# Patient Record
Sex: Female | Born: 1985 | Race: Black or African American | Hispanic: No | Marital: Single | Smoking: Former smoker
Health system: Southern US, Community
[De-identification: ages and names within clinical notes are randomized; demographics above are authoritative.]

## PROBLEM LIST (undated history)

## (undated) DIAGNOSIS — K8020 Calculus of gallbladder without cholecystitis without obstruction: Secondary | ICD-10-CM

## (undated) DIAGNOSIS — F329 Major depressive disorder, single episode, unspecified: Secondary | ICD-10-CM

## (undated) DIAGNOSIS — F32A Depression, unspecified: Secondary | ICD-10-CM

## (undated) DIAGNOSIS — F419 Anxiety disorder, unspecified: Secondary | ICD-10-CM

## (undated) HISTORY — PX: TONSILLECTOMY: SUR1361

---

## 2006-02-14 ENCOUNTER — Emergency Department: Payer: Self-pay | Admitting: General Practice

## 2010-09-23 ENCOUNTER — Observation Stay: Payer: Self-pay | Admitting: Obstetrics and Gynecology

## 2011-01-21 ENCOUNTER — Observation Stay: Payer: Self-pay | Admitting: Obstetrics and Gynecology

## 2011-01-22 ENCOUNTER — Inpatient Hospital Stay: Payer: Self-pay | Admitting: Obstetrics and Gynecology

## 2011-07-17 ENCOUNTER — Emergency Department: Payer: Self-pay | Admitting: Internal Medicine

## 2013-09-09 ENCOUNTER — Encounter (HOSPITAL_COMMUNITY): Payer: Self-pay | Admitting: Emergency Medicine

## 2013-09-09 ENCOUNTER — Emergency Department (HOSPITAL_COMMUNITY)
Admission: EM | Admit: 2013-09-09 | Discharge: 2013-09-09 | Disposition: A | Discharge status: Home / Self Care | Payer: Medicaid Other | Attending: Emergency Medicine | Admitting: Emergency Medicine

## 2013-09-09 DIAGNOSIS — O9989 Other specified diseases and conditions complicating pregnancy, childbirth and the puerperium: Secondary | ICD-10-CM | POA: Insufficient documentation

## 2013-09-09 DIAGNOSIS — Z3490 Encounter for supervision of normal pregnancy, unspecified, unspecified trimester: Secondary | ICD-10-CM

## 2013-09-09 DIAGNOSIS — Z8659 Personal history of other mental and behavioral disorders: Secondary | ICD-10-CM | POA: Insufficient documentation

## 2013-09-09 DIAGNOSIS — Z3201 Encounter for pregnancy test, result positive: Secondary | ICD-10-CM | POA: Insufficient documentation

## 2013-09-09 DIAGNOSIS — K5900 Constipation, unspecified: Secondary | ICD-10-CM | POA: Insufficient documentation

## 2013-09-09 DIAGNOSIS — O99330 Smoking (tobacco) complicating pregnancy, unspecified trimester: Secondary | ICD-10-CM | POA: Insufficient documentation

## 2013-09-09 HISTORY — DX: Depression, unspecified: F32.A

## 2013-09-09 HISTORY — DX: Major depressive disorder, single episode, unspecified: F32.9

## 2013-09-09 HISTORY — DX: Anxiety disorder, unspecified: F41.9

## 2013-09-09 LAB — POCT PREGNANCY, URINE: Preg Test, Ur: POSITIVE — AB

## 2013-09-09 MED ORDER — PRENATAL COMPLETE 14-0.4 MG PO TABS: 1.0000 | ORAL_TABLET | Freq: Every day | ORAL | Status: DC

## 2013-09-09 NOTE — ED Notes (Signed)
Patient states that she has been constipated x 1 month.   Patient states she went out last night and she was drinking.  She states that she has been having abdominal pain today.

## 2013-09-09 NOTE — ED Provider Notes (Signed)
CSN: [id number]     Arrival date & time 09/09/13  1359 History   First MD Initiated Contact with Patient 09/09/13 1406     Chief Complaint  Patient presents with  . Abdominal Pain   (Consider location/radiation/quality/duration/timing/severity/associated sxs/prior Treatment) The history is provided by the patient and medical records.   Patient presents to the ED for constipation.  Patient states she went out with friends last night and did have a few alcoholic beverages. She ate at [facility] at approximately 0400 this morning.  Pt states upon waking this morning she felt very nauseated and had one episode of non-bloody, non-bilious vomiting.  No further episodes of vomiting.  Pt has not tried to eat since earlier this morning but states she feels hungry. Patient states she's always had irregular bowel movements, sometimes 1 bowel movement per week but states she has not had a normal bowel movement in approximately one month. She is freely passing gas.  Has had some cramping, lower abdominal pain today.  No fevers, sweats, or chills.   No urinary sx or vaginal complaints.  Past Medical History  Diagnosis Date  . Anxiety   . Depression    Past Surgical History  Procedure Laterality Date  . Tonsillectomy     No family history on file. History  Substance Use Topics  . Smoking status: Current Some Day Smoker    Types: Cigarettes  . Smokeless tobacco: Not on file  . Alcohol Use: Yes   OB History   Grav Para Term Preterm Abortions TAB SAB Ect Mult Living                 Review of Systems  Gastrointestinal: Positive for constipation.  All other systems reviewed and are negative.    Allergies  Review of patient's allergies indicates no known allergies.  Home Medications  No current outpatient prescriptions on file. BP 106/67  Pulse 91  Temp(Src) 98.3 F (36.8 C) (Oral)  Resp 20  Ht 5\' 1"  (1.549 m)  Wt 146 lb (66.225 kg)  BMI 27.6 kg/m2  SpO2 100%  LMP 07/10/2013  Physical  Exam  Nursing note and vitals reviewed. Constitutional: She is oriented to person, place, and time. She appears well-developed and well-nourished. No distress.  HENT:  Head: Normocephalic and atraumatic.  Mouth/Throat: Oropharynx is clear and moist.  Eyes: Conjunctivae and EOM are normal. Pupils are equal, round, and reactive to light.  Neck: Normal range of motion. Neck supple.  Cardiovascular: Normal rate, regular rhythm and normal heart sounds.   Pulmonary/Chest: Effort normal and breath sounds normal. No respiratory distress. She has no wheezes.  Abdominal: Soft. Bowel sounds are normal. She exhibits no distension. There is no tenderness. There is no guarding.  Abdomen soft, no rebound or guarding; no peritoneal signs  Musculoskeletal: Normal range of motion. She exhibits no edema.  Neurological: She is alert and oriented to person, place, and time.  Skin: Skin is warm and dry. She is not diaphoretic.  Psychiatric: She has a normal mood and affect.    ED Course  Procedures (including critical care time) Labs Review Labs Reviewed - No data to display Imaging Review No results found.  MDM   1. Pregnancy    u-preg done prior to abd imaging incidentally positive.  Pt is now G4P2.  Last menstrual she remembers was in May.  No hx of ectopic pregnancies.  Has had some spotting in the past few months, but no vaginal bleeding at this time.  I  have recommended a pelvic exam, pt declined.  Bedside u/s performed at bedside by Dr. Hyacinth Meeker revealing IUP w/cardiac activity, likely 4-5 months gestation.    I doubt acute/surgical abdomen at this including SBO.  Encouraged high fiber diet and increase water to help with BMs. Will start on prenatal vitamins and have pt FU with OB-GYN. Discussed plan with pt, she agreed.  Return precautions advised.  Garlon Hatchet, PA-C 09/09/13 1556

## 2013-09-09 NOTE — ED Provider Notes (Signed)
27 year old, presents with inconsistent bowel movements, on exam she has soft abdomen, she does have an ultrasound showing intrauterine pregnancy on my exam, the patient was appear stable, she can followup as an outpatient.  Medical screening examination/treatment/procedure(s) were conducted as a shared visit with non-physician practitioner(s) and myself.  I personally evaluated the patient during the encounter.        Vida Roller, MD 09/09/13 1540

## 2013-09-12 NOTE — ED Provider Notes (Signed)
Medical screening examination/treatment/procedure(s) were conducted as a shared visit with non-physician practitioner(s) and myself.  I personally evaluated the patient during the encounter  Please see my separate respective documentation pertaining to this patient encounter   Vida Roller, MD 09/12/13 1456

## 2014-07-29 ENCOUNTER — Observation Stay: Payer: Self-pay | Admitting: Obstetrics and Gynecology

## 2014-07-29 LAB — CBC WITH DIFFERENTIAL/PLATELET
Basophil #: 0 10*3/uL (ref 0.0–0.1)
Basophil %: 0.2 %
EOS PCT: 2.2 %
Eosinophil #: 0.3 10*3/uL (ref 0.0–0.7)
HCT: 32.5 % — AB (ref 35.0–47.0)
HGB: 10.4 g/dL — ABNORMAL LOW (ref 12.0–16.0)
Lymphocyte #: 2 10*3/uL (ref 1.0–3.6)
Lymphocyte %: 14.1 %
MCH: 29.4 pg (ref 26.0–34.0)
MCHC: 31.9 g/dL — ABNORMAL LOW (ref 32.0–36.0)
MCV: 92 fL (ref 80–100)
MONOS PCT: 4.6 %
Monocyte #: 0.7 x10 3/mm (ref 0.2–0.9)
Neutrophil #: 11.3 10*3/uL — ABNORMAL HIGH (ref 1.4–6.5)
Neutrophil %: 78.9 %
PLATELETS: 225 10*3/uL (ref 150–440)
RBC: 3.52 10*6/uL — AB (ref 3.80–5.20)
RDW: 13.6 % (ref 11.5–14.5)
WBC: 14.3 10*3/uL — ABNORMAL HIGH (ref 3.6–11.0)

## 2014-11-03 ENCOUNTER — Inpatient Hospital Stay: Payer: Self-pay

## 2014-11-05 LAB — CBC WITH DIFFERENTIAL/PLATELET
BASOS ABS: 0.1 10*3/uL (ref 0.0–0.1)
Basophil %: 0.6 %
EOS ABS: 0.1 10*3/uL (ref 0.0–0.7)
EOS PCT: 0.9 %
HCT: 31.6 % — AB (ref 35.0–47.0)
HGB: 9.8 g/dL — ABNORMAL LOW (ref 12.0–16.0)
LYMPHS ABS: 1.7 10*3/uL (ref 1.0–3.6)
LYMPHS PCT: 14.9 %
MCH: 25.3 pg — ABNORMAL LOW (ref 26.0–34.0)
MCHC: 31 g/dL — ABNORMAL LOW (ref 32.0–36.0)
MCV: 82 fL (ref 80–100)
MONO ABS: 1 x10 3/mm — AB (ref 0.2–0.9)
Monocyte %: 8.5 %
NEUTROS PCT: 75.1 %
Neutrophil #: 8.7 10*3/uL — ABNORMAL HIGH (ref 1.4–6.5)
Platelet: 157 10*3/uL (ref 150–440)
RBC: 3.86 10*6/uL (ref 3.80–5.20)
RDW: 16.1 % — AB (ref 11.5–14.5)
WBC: 11.6 10*3/uL — ABNORMAL HIGH (ref 3.6–11.0)

## 2014-11-06 LAB — HEMATOCRIT: HCT: 30.2 % — AB (ref 35.0–47.0)
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
## 2015-12-09 HISTORY — PX: DILATION AND CURETTAGE OF UTERUS: SHX78

## 2016-02-18 ENCOUNTER — Emergency Department (HOSPITAL_COMMUNITY): Payer: Medicaid Other

## 2016-02-18 ENCOUNTER — Emergency Department (HOSPITAL_COMMUNITY)
Admission: EM | Admit: 2016-02-18 | Discharge: 2016-02-18 | Disposition: A | Discharge status: Home / Self Care | Payer: Medicaid Other | Attending: Emergency Medicine | Admitting: Emergency Medicine

## 2016-02-18 ENCOUNTER — Encounter (HOSPITAL_COMMUNITY): Payer: Self-pay | Admitting: Emergency Medicine

## 2016-02-18 DIAGNOSIS — S0990XA Unspecified injury of head, initial encounter: Secondary | ICD-10-CM | POA: Diagnosis present

## 2016-02-18 DIAGNOSIS — F17210 Nicotine dependence, cigarettes, uncomplicated: Secondary | ICD-10-CM | POA: Diagnosis not present

## 2016-02-18 DIAGNOSIS — T148XXA Other injury of unspecified body region, initial encounter: Secondary | ICD-10-CM

## 2016-02-18 DIAGNOSIS — S0003XA Contusion of scalp, initial encounter: Secondary | ICD-10-CM | POA: Insufficient documentation

## 2016-02-18 DIAGNOSIS — Y9389 Activity, other specified: Secondary | ICD-10-CM | POA: Insufficient documentation

## 2016-02-18 DIAGNOSIS — S7011XA Contusion of right thigh, initial encounter: Secondary | ICD-10-CM | POA: Diagnosis not present

## 2016-02-18 DIAGNOSIS — Z23 Encounter for immunization: Secondary | ICD-10-CM | POA: Insufficient documentation

## 2016-02-18 DIAGNOSIS — F419 Anxiety disorder, unspecified: Secondary | ICD-10-CM | POA: Diagnosis not present

## 2016-02-18 DIAGNOSIS — T07XXXA Unspecified multiple injuries, initial encounter: Secondary | ICD-10-CM

## 2016-02-18 DIAGNOSIS — S60511A Abrasion of right hand, initial encounter: Secondary | ICD-10-CM | POA: Insufficient documentation

## 2016-02-18 DIAGNOSIS — Y9289 Other specified places as the place of occurrence of the external cause: Secondary | ICD-10-CM | POA: Insufficient documentation

## 2016-02-18 DIAGNOSIS — S59902A Unspecified injury of left elbow, initial encounter: Secondary | ICD-10-CM | POA: Insufficient documentation

## 2016-02-18 DIAGNOSIS — S5012XA Contusion of left forearm, initial encounter: Secondary | ICD-10-CM | POA: Diagnosis not present

## 2016-02-18 DIAGNOSIS — Y998 Other external cause status: Secondary | ICD-10-CM | POA: Insufficient documentation

## 2016-02-18 DIAGNOSIS — M79632 Pain in left forearm: Secondary | ICD-10-CM

## 2016-02-18 DIAGNOSIS — S6992XA Unspecified injury of left wrist, hand and finger(s), initial encounter: Secondary | ICD-10-CM | POA: Insufficient documentation

## 2016-02-18 DIAGNOSIS — S060X0A Concussion without loss of consciousness, initial encounter: Secondary | ICD-10-CM | POA: Diagnosis not present

## 2016-02-18 DIAGNOSIS — S5011XA Contusion of right forearm, initial encounter: Secondary | ICD-10-CM | POA: Diagnosis not present

## 2016-02-18 DIAGNOSIS — S7012XA Contusion of left thigh, initial encounter: Secondary | ICD-10-CM | POA: Diagnosis not present

## 2016-02-18 DIAGNOSIS — M62838 Other muscle spasm: Secondary | ICD-10-CM | POA: Insufficient documentation

## 2016-02-18 DIAGNOSIS — F329 Major depressive disorder, single episode, unspecified: Secondary | ICD-10-CM | POA: Insufficient documentation

## 2016-02-18 DIAGNOSIS — S46812A Strain of other muscles, fascia and tendons at shoulder and upper arm level, left arm, initial encounter: Secondary | ICD-10-CM | POA: Diagnosis not present

## 2016-02-18 DIAGNOSIS — S300XXA Contusion of lower back and pelvis, initial encounter: Secondary | ICD-10-CM | POA: Insufficient documentation

## 2016-02-18 MED ORDER — TETANUS-DIPHTH-ACELL PERTUSSIS 5-2.5-18.5 LF-MCG/0.5 IM SUSP
0.5000 mL | Freq: Once | INTRAMUSCULAR | Status: AC
  Administered 2016-02-18: 0.5 mL via INTRAMUSCULAR
  Filled 2016-02-18: qty 0.5

## 2016-02-18 MED ORDER — MORPHINE SULFATE (PF) 4 MG/ML IV SOLN
4.0000 mg | Freq: Once | INTRAVENOUS | Status: AC
  Administered 2016-02-18: 4 mg via INTRAVENOUS
  Filled 2016-02-18: qty 1

## 2016-02-18 MED ORDER — HYDROCODONE-ACETAMINOPHEN 5-325 MG PO TABS: 1.0000 | ORAL_TABLET | Freq: Four times a day (QID) | ORAL | Status: DC | PRN

## 2016-02-18 MED ORDER — CYCLOBENZAPRINE HCL 10 MG PO TABS: 10.0000 mg | ORAL_TABLET | Freq: Three times a day (TID) | ORAL | Status: DC | PRN

## 2016-02-18 MED ORDER — HYDROCODONE-ACETAMINOPHEN 5-325 MG PO TABS
1.0000 | ORAL_TABLET | Freq: Once | ORAL | Status: AC
  Administered 2016-02-18: 1 via ORAL
  Filled 2016-02-18 (×2): qty 1

## 2016-02-18 MED ORDER — AMOXICILLIN-POT CLAVULANATE 875-125 MG PO TABS: 1.0000 | ORAL_TABLET | Freq: Two times a day (BID) | ORAL | Status: DC

## 2016-02-18 MED ORDER — NAPROXEN 500 MG PO TABS: 500.0000 mg | ORAL_TABLET | Freq: Two times a day (BID) | ORAL | Status: DC | PRN
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
## 2016-02-18 NOTE — ED Notes (Signed)
PA at bedside.

## 2016-02-18 NOTE — ED Notes (Signed)
Patient transported to CT 

## 2016-02-18 NOTE — ED Notes (Signed)
[place] sandwich and drink given to pt, ok per PA.
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
## 2016-02-18 NOTE — Discharge Instructions (Signed)
Take naprosyn as directed for inflammation and pain with norco for breakthrough pain and flexeril for muscle relaxation. Do not drive or operate machinery with pain medication or muscle relaxant use. Ice to areas of soreness for the next 24 hours and then may move to heat, no more than 20 minutes at a time every hour for each. Expect to be sore for the next few days and follow up with primary care physician for recheck of ongoing symptoms in the next 1 week.  For your arm/hand injuries: Take augmentin antibiotic as directed for the bite mark on your hand. Use ace wraps as needed for comfort and compression of the arms/wrists. Use left wrist brace for 1 week to help with left wrist pain/sprain. Use sling for comfort, but make sure to perform shoulder range of motion exercises daily. Follow up with the hand specialist Dr. Janee Morn in 1 week as needed for ongoing symptoms.  For your neck/back pain: use the medications listed above for pain, use ice or heat as instructed above, and follow up with the orthopedist Dr. Eulah Pont in 1-2 weeks as needed for ongoing symptoms.  For your concussion: Get plenty of rest, use ice on your head.  Stay in a quiet, not simulating, dark environment. No TV, computer use, video games, or cell phone use until headache is resolved completely.  Follow Up with primary care physician in 3-4 days if headache persists.  Return to the emergency department if patient becomes lethargic, begins vomiting or other change in mental status.  Return to ER for emergent changing or worsening of symptoms.    Muscle Strain A muscle strain (pulled muscle) happens when a muscle is stretched beyond normal length. It happens when a sudden, violent force stretches your muscle too far. Usually, a few of the fibers in your muscle are torn. Muscle strain is common in athletes. Recovery usually takes 1-2 weeks. Complete healing takes 5-6 weeks.  HOME CARE   Follow the PRICE method of treatment to help your  injury get better. Do this the first 2-3 days after the injury:  Protect. Protect the muscle to keep it from getting injured again.  Rest. Limit your activity and rest the injured body part.  Ice. Put ice in a plastic bag. Place a towel between your skin and the bag. Then, apply the ice and leave it on from 15-20 minutes each hour. After the third day, switch to moist heat packs.  Compression. Use a splint or elastic bandage on the injured area for comfort. Do not put it on too tightly.  Elevate. Keep the injured body part above the level of your heart.  Only take medicine as told by your doctor.  Warm up before doing exercise to prevent future muscle strains. GET HELP IF:   You have more pain or puffiness (swelling) in the injured area.  You feel numbness, tingling, or notice a loss of strength in the injured area. MAKE SURE YOU:   Understand these instructions.  Will watch your condition.  Will get help right away if you are not doing well or get worse.   This information is not intended to replace advice given to you by your health care provider. Make sure you discuss any questions you have with your health care provider.   Document Released: 09/02/2008 Document Revised: 09/14/2013 Document Reviewed: 06/23/2013 Elsevier Interactive Patient Education 2016 [employer]  Musculoskeletal Pain Musculoskeletal pain is muscle and boney aches and pains. These pains can occur in any part  of the body. Your caregiver may treat you without knowing the cause of the pain. They may treat you if blood or urine tests, X-rays, and other tests were normal.  CAUSES There is often not a definite cause or reason for these pains. These pains may be caused by a type of germ (virus). The discomfort may also come from overuse. Overuse includes working out too hard when your body is not fit. Boney aches also come from weather changes. Bone is sensitive to atmospheric pressure changes. HOME CARE  INSTRUCTIONS   Ask when your test results will be ready. Make sure you get your test results.  Only take over-the-counter or prescription medicines for pain, discomfort, or fever as directed by your caregiver. If you were given medications for your condition, do not drive, operate machinery or power tools, or sign legal documents for 24 hours. Do not drink alcohol. Do not take sleeping pills or other medications that may interfere with treatment.  Continue all activities unless the activities cause more pain. When the pain lessens, slowly resume normal activities. Gradually increase the intensity and duration of the activities or exercise.  During periods of severe pain, bed rest may be helpful. Lay or sit in any position that is comfortable.  Putting ice on the injured area.  Put ice in a bag.  Place a towel between your skin and the bag.  Leave the ice on for 15 to 20 minutes, 3 to 4 times a day.  Follow up with your caregiver for continued problems and no reason can be found for the pain. If the pain becomes worse or does not go away, it may be necessary to repeat tests or do additional testing. Your caregiver may need to look further for a possible cause. SEEK IMMEDIATE MEDICAL CARE IF:  You have pain that is getting worse and is not relieved by medications.  You develop chest pain that is associated with shortness or breath, sweating, feeling sick to your stomach (nauseous), or throw up (vomit).  Your pain becomes localized to the abdomen.  You develop any new symptoms that seem different or that concern you. MAKE SURE YOU:   Understand these instructions.  Will watch your condition.  Will get help right away if you are not doing well or get worse.   This information is not intended to replace advice given to you by your health care provider. Make sure you discuss any questions you have with your health care provider.   Document Released: 11/24/2005 Document Revised:  02/16/2012 Document Reviewed: 07/29/2013 Elsevier Interactive Patient Education 2016 [employer].  General Assault Assault includes any behavior or physical attack--whether it is on purpose or not--that results in injury to another person, damage to property, or both. This also includes assault that has not yet happened, but is planned to happen. Threats of assault may be physical, verbal, or written. They may be said or sent by:  Mail.  E-mail.  Text.  Social media.  Fax. The threats may be direct, implied, or understood. WHAT ARE THE DIFFERENT FORMS OF ASSAULT? Forms of assault include:  Physically assaulting a person. This includes physical threats to inflict physical harm as well as:  Slapping.  Hitting.  Poking.  Kicking.  Punching.  Pushing.  Sexually assaulting a person. Sexual assault is any sexual activity that a person is forced, threatened, or coerced to participate in. It may or may not involve physical contact with the person who is assaulting you. You are sexually  assaulted if you are forced to have sexual contact of any kind.  Damaging or destroying a person's assistive equipment, such as glasses, canes, or walkers.  Throwing or hitting objects.  Using or displaying a weapon to harm or threaten someone.  Using or displaying an object that appears to be a weapon in a threatening manner.  Using greater physical size or strength to intimidate someone.  Making intimidating or threatening gestures.  Bullying.  Hazing.  Using language that is intimidating, threatening, hostile, or abusive.  Stalking.  Restraining someone with force. WHAT SHOULD I DO IF I EXPERIENCE ASSAULT?  Report assaults, threats, and stalking to the police. Call your local emergency services (911 in the [place] if you are in immediate danger or you need medical help.  You can work with a Clinical research associate or an advocate to get legal protection against someone who has assaulted you or  threatened you with assault. Protection includes restraining orders and private addresses. Crimes against you, such as assault, can also be prosecuted through the courts. Laws will vary depending on where you live.   This information is not intended to replace advice given to you by your health care provider. Make sure you discuss any questions you have with your health care provider.   Document Released: 11/24/2005 Document Revised: 12/15/2014 Document Reviewed: 08/11/2014 Elsevier Interactive Patient Education 2016 [employer].  Concussion, Adult A concussion, or closed-head injury, is a brain injury caused by a direct blow to the head or by a quick and sudden movement (jolt) of the head or neck. Concussions are usually not life-threatening. Even so, the effects of a concussion can be serious. If you have had a concussion before, you are more likely to experience concussion-like symptoms after a direct blow to the head.  CAUSES  Direct blow to the head, such as from running into another player during a soccer game, being hit in a fight, or hitting your head on a hard surface.  A jolt of the head or neck that causes the brain to move back and forth inside the skull, such as in a car crash. SIGNS AND SYMPTOMS The signs of a concussion can be hard to notice. Early on, they may be missed by you, family members, and health care providers. You may look fine but act or feel differently. Symptoms are usually temporary, but they may last for days, weeks, or even longer. Some symptoms may appear right away while others may not show up for hours or days. Every head injury is different. Symptoms include:  Mild to moderate headaches that will not go away.  A feeling of pressure inside your head.  Having more trouble than usual:  Learning or remembering things you have heard.  Answering questions.  Paying attention or concentrating.  Organizing daily tasks.  Making decisions and solving  problems.  Slowness in thinking, acting or reacting, speaking, or reading.  Getting lost or being easily confused.  Feeling tired all the time or lacking energy (fatigued).  Feeling drowsy.  Sleep disturbances.  Sleeping more than usual.  Sleeping less than usual.  Trouble falling asleep.  Trouble sleeping (insomnia).  Loss of balance or feeling lightheaded or dizzy.  Nausea or vomiting.  Numbness or tingling.  Increased sensitivity to:  Sounds.  Lights.  Distractions.  Vision problems or eyes that tire easily.  Diminished sense of taste or smell.  Ringing in the ears.  Mood changes such as feeling sad or anxious.  Becoming easily irritated or angry  for little or no reason.  Lack of motivation.  Seeing or hearing things other people do not see or hear (hallucinations). DIAGNOSIS Your health care provider can usually diagnose a concussion based on a description of your injury and symptoms. He or she will ask whether you passed out (lost consciousness) and whether you are having trouble remembering events that happened right before and during your injury. Your evaluation might include:  A brain scan to look for signs of injury to the brain. Even if the test shows no injury, you may still have a concussion.  Blood tests to be sure other problems are not present. TREATMENT  Concussions are usually treated in an emergency department, in urgent care, or at a clinic. You may need to stay in the hospital overnight for further treatment.  Tell your health care provider if you are taking any medicines, including prescription medicines, over-the-counter medicines, and natural remedies. Some medicines, such as blood thinners (anticoagulants) and aspirin, may increase the chance of complications. Also tell your health care provider whether you have had alcohol or are taking illegal drugs. This information may affect treatment.  Your health care provider will send you  home with important instructions to follow.  How fast you will recover from a concussion depends on many factors. These factors include how severe your concussion is, what part of your brain was injured, your age, and how healthy you were before the concussion.  Most people with mild injuries recover fully. Recovery can take time. In general, recovery is slower in older persons. Also, persons who have had a concussion in the past or have other medical problems may find that it takes longer to recover from their current injury. HOME CARE INSTRUCTIONS General Instructions  Carefully follow the directions your health care provider gave you.  Only take over-the-counter or prescription medicines for pain, discomfort, or fever as directed by your health care provider.  Take only those medicines that your health care provider has approved.  Do not drink alcohol until your health care provider says you are well enough to do so. Alcohol and certain other drugs may slow your recovery and can put you at risk of further injury.  If it is harder than usual to remember things, write them down.  If you are easily distracted, try to do one thing at a time. For example, do not try to watch TV while fixing dinner.  Talk with family members or close friends when making important decisions.  Keep all follow-up appointments. Repeated evaluation of your symptoms is recommended for your recovery.  Watch your symptoms and tell others to do the same. Complications sometimes occur after a concussion. Older adults with a brain injury may have a higher risk of serious complications, such as a blood clot on the brain.  Tell your teachers, school nurse, school counselor, coach, athletic trainer, or work Production designer, theatre/television/filmmanager about your injury, symptoms, and restrictions. Tell them about what you can or cannot do. They should watch for:  Increased problems with attention or concentration.  Increased difficulty remembering or  learning new information.  Increased time needed to complete tasks or assignments.  Increased irritability or decreased ability to cope with stress.  Increased symptoms.  Rest. Rest helps the brain to heal. Make sure you:  Get plenty of sleep at night. Avoid staying up late at night.  Keep the same bedtime hours on weekends and weekdays.  Rest during the day. Take daytime naps or rest breaks when you feel  tired.  Limit activities that require a lot of thought or concentration. These include:  Doing homework or job-related work.  Watching TV.  Working on the computer.  Avoid any situation where there is potential for another head injury (football, hockey, soccer, basketball, martial arts, downhill snow sports and horseback riding). Your condition will get worse every time you experience a concussion. You should avoid these activities until you are evaluated by the appropriate follow-up health care providers. Returning To Your Regular Activities You will need to return to your normal activities slowly, not all at once. You must give your body and brain enough time for recovery.  Do not return to sports or other athletic activities until your health care provider tells you it is safe to do so.  Ask your health care provider when you can drive, ride a bicycle, or operate heavy machinery. Your ability to react may be slower after a brain injury. Never do these activities if you are dizzy.  Ask your health care provider about when you can return to work or school. Preventing Another Concussion It is very important to avoid another brain injury, especially before you have recovered. In rare cases, another injury can lead to permanent brain damage, brain swelling, or death. The risk of this is greatest during the first 7-10 days after a head injury. Avoid injuries by:  Wearing a seat belt when riding in a car.  Drinking alcohol only in moderation.  Wearing a helmet when biking,  skiing, skateboarding, skating, or doing similar activities.  Avoiding activities that could lead to a second concussion, such as contact or recreational sports, until your health care provider says it is okay.  Taking safety measures in your home.  Remove clutter and tripping hazards from floors and stairways.  Use grab bars in bathrooms and handrails by stairs.  Place non-slip mats on floors and in bathtubs.  Improve lighting in dim areas. SEEK MEDICAL CARE IF:  You have increased problems paying attention or concentrating.  You have increased difficulty remembering or learning new information.  You need more time to complete tasks or assignments than before.  You have increased irritability or decreased ability to cope with stress.  You have more symptoms than before. Seek medical care if you have any of the following symptoms for more than 2 weeks after your injury:  Lasting (chronic) headaches.  Dizziness or balance problems.  Nausea.  Vision problems.  Increased sensitivity to noise or light.  Depression or mood swings.  Anxiety or irritability.  Memory problems.  Difficulty concentrating or paying attention.  Sleep problems.  Feeling tired all the time. SEEK IMMEDIATE MEDICAL CARE IF:  You have severe or worsening headaches. These may be a sign of a blood clot in the brain.  You have weakness (even if only in one hand, leg, or part of the face).  You have numbness.  You have decreased coordination.  You vomit repeatedly.  You have increased sleepiness.  One pupil is larger than the other.  You have convulsions.  You have slurred speech.  You have increased confusion. This may be a sign of a blood clot in the brain.  You have increased restlessness, agitation, or irritability.  You are unable to recognize people or places.  You have neck pain.  It is difficult to wake you up.  You have unusual behavior changes.  You lose  consciousness. MAKE SURE YOU:  Understand these instructions.  Will watch your condition.  Will get help  right away if you are not doing well or get worse.   This information is not intended to replace advice given to you by your health care provider. Make sure you discuss any questions you have with your health care provider.   Document Released: 02/14/2004 Document Revised: 12/15/2014 Document Reviewed: 06/16/2013 Elsevier Interactive Patient Education 2016 [employer] Inc[employer]  Cryotherapy Cryotherapy means treatment with cold. Ice or gel packs can be used to reduce both pain and swelling. Ice is the most helpful within the first 24 to 48 hours after an injury or flare-up from overusing a muscle or joint. Sprains, strains, spasms, burning pain, shooting pain, and aches can all be eased with ice. Ice can also be used when recovering from surgery. Ice is effective, has very few side effects, and is safe for most people to use. PRECAUTIONS  Ice is not a safe treatment option for people with:  Raynaud phenomenon. This is a condition affecting small blood vessels in the extremities. Exposure to cold may cause your problems to return.  Cold hypersensitivity. There are many forms of cold hypersensitivity, including:  Cold urticaria. Red, itchy hives appear on the skin when the tissues begin to warm after being iced.  Cold erythema. This is a red, itchy rash caused by exposure to cold.  Cold hemoglobinuria. Red blood cells break down when the tissues begin to warm after being iced. The hemoglobin that carry oxygen are passed into the urine because they cannot combine with blood proteins fast enough.  Numbness or altered sensitivity in the area being iced. If you have any of the following conditions, do not use ice until you have discussed cryotherapy with your caregiver:  Heart conditions, such as arrhythmia, angina, or chronic heart disease.  High blood pressure.  Healing wounds or open  skin in the area being iced.  Current infections.  Rheumatoid arthritis.  Poor circulation.  Diabetes. Ice slows the blood flow in the region it is applied. This is beneficial when trying to stop inflamed tissues from spreading irritating chemicals to surrounding tissues. However, if you expose your skin to cold temperatures for too long or without the proper protection, you can damage your skin or nerves. Watch for signs of skin damage due to cold. HOME CARE INSTRUCTIONS Follow these tips to use ice and cold packs safely.  Place a dry or damp towel between the ice and skin. A damp towel will cool the skin more quickly, so you may need to shorten the time that the ice is used.  For a more rapid response, add gentle compression to the ice.  Ice for no more than 10 to 20 minutes at a time. The bonier the area you are icing, the less time it will take to get the benefits of ice.  Check your skin after 5 minutes to make sure there are no signs of a poor response to cold or skin damage.  Rest 20 minutes or more between uses.  Once your skin is numb, you can end your treatment. You can test numbness by very lightly touching your skin. The touch should be so light that you do not see the skin dimple from the pressure of your fingertip. When using ice, most people will feel these normal sensations in this order: cold, burning, aching, and numbness.  Do not use ice on someone who cannot communicate their responses to pain, such as small children or people with dementia. HOW TO MAKE AN ICE PACK Ice packs are the most  common way to use ice therapy. Other methods include ice massage, ice baths, and cryosprays. Muscle creams that cause a cold, tingly feeling do not offer the same benefits that ice offers and should not be used as a substitute unless recommended by your caregiver. To make an ice pack, do one of the following:  Place crushed ice or a bag of frozen vegetables in a sealable plastic bag.  Squeeze out the excess air. Place this bag inside another plastic bag. Slide the bag into a pillowcase or place a damp towel between your skin and the bag.  Mix 3 parts water with 1 part rubbing alcohol. Freeze the mixture in a sealable plastic bag. When you remove the mixture from the freezer, it will be slushy. Squeeze out the excess air. Place this bag inside another plastic bag. Slide the bag into a pillowcase or place a damp towel between your skin and the bag. SEEK MEDICAL CARE IF:  You develop white spots on your skin. This may give the skin a blotchy (mottled) appearance.  Your skin turns blue or pale.  Your skin becomes waxy or hard.  Your swelling gets worse. MAKE SURE YOU:   Understand these instructions.  Will watch your condition.  Will get help right away if you are not doing well or get worse.   This information is not intended to replace advice given to you by your health care provider. Make sure you discuss any questions you have with your health care provider.   Document Released: 07/21/2011 Document Revised: 12/15/2014 Document Reviewed: 07/21/2011 Elsevier Interactive Patient Education 2016 [employer].  Adult nurse and RICE WHAT DOES AN ELASTIC BANDAGE DO? Elastic bandages come in different shapes and sizes. They generally provide support to your injury and reduce swelling while you are healing, but they can perform different functions. Your health care provider will help you to decide what is best for your protection, recovery, or rehabilitation following an injury. WHAT ARE SOME GENERAL TIPS FOR USING AN ELASTIC BANDAGE?  Use the bandage as directed by the maker of the bandage that you are using.  Do not wrap the bandage too tightly. This may cut off the circulation in the arm or leg in the area below the bandage.  If part of your body beyond the bandage becomes blue, numb, cold, swollen, or is more painful, your bandage is most likely too tight. If this  occurs, remove your bandage and reapply it more loosely.  See your health care provider if the bandage seems to be making your problems worse rather than better.  An elastic bandage should be removed and reapplied every 3-4 hours or as directed by your health care provider. WHAT IS RICE? The routine care of many injuries includes rest, ice, compression, and elevation (RICE therapy).  Rest Rest is required to allow your body to heal. Generally, you can resume your routine activities when you are comfortable and have been given permission by your health care provider. Ice Icing your injury helps to keep the swelling down and it reduces pain. Do not apply ice directly to your skin.  Put ice in a plastic bag.  Place a towel between your skin and the bag.  Leave the ice on for 20 minutes, 2-3 times per day. Do this for as long as you are directed by your health care provider. Compression Compression helps to keep swelling down, gives support, and helps with discomfort. Compression may be done with an elastic bandage. Elevation Elevation  helps to reduce swelling and it decreases pain. If possible, your injured area should be placed at or above the level of your heart or the center of your chest. WHEN SHOULD I SEEK MEDICAL CARE? You should seek medical care if:  You have persistent pain and swelling.  Your symptoms are getting worse rather than improving. These symptoms may indicate that further evaluation or further X-rays are needed. Sometimes, X-rays may not show a small broken bone (fracture) until a number of days later. Make a follow-up appointment with your health care provider. Ask when your X-ray results will be ready. Make sure that you get your X-ray results. WHEN SHOULD I SEEK IMMEDIATE MEDICAL CARE? You should seek immediate medical care if:  You have a sudden onset of severe pain at or below the area of your injury.  You develop redness or increased swelling around your  injury.  You have tingling or numbness at or below the area of your injury that does not improve after you remove the elastic bandage.   This information is not intended to replace advice given to you by your health care provider. Make sure you discuss any questions you have with your health care provider.   Document Released: 05/16/2002 Document Revised: 08/15/2015 Document Reviewed: 07/10/2014 Elsevier Interactive Patient Education 2016 Elsevier Inc.  [employer] Therapy Heat therapy can help ease sore, stiff, injured, and tight muscles and joints. Heat relaxes your muscles, which may help ease your pain. Heat therapy should only be used on old, pre-existing, or long-lasting (chronic) injuries. Do not use heat therapy unless told by your doctor. HOW TO USE HEAT THERAPY There are several different kinds of heat therapy, including:  Moist heat pack.  Warm water bath.  Hot water bottle.  Electric heating pad.  Heated gel pack.  Heated wrap.  Electric heating pad. GENERAL HEAT THERAPY RECOMMENDATIONS   Do not sleep while using heat therapy. Only use heat therapy while you are awake.  Your skin may turn pink while using heat therapy. Do not use heat therapy if your skin turns red.  Do not use heat therapy if you have new pain.  High heat or long exposure to heat can cause burns. Be careful when using heat therapy to avoid burning your skin.  Do not use heat therapy on areas of your skin that are already irritated, such as with a rash or sunburn. GET HELP IF:   You have blisters, redness, swelling (puffiness), or numbness.  You have new pain.  Your pain is worse. MAKE SURE YOU:  Understand these instructions.  Will watch your condition.  Will get help right away if you are not doing well or get worse.   This information is not intended to replace advice given to you by your health care provider. Make sure you discuss any questions you have with your health care provider.     Document Released: 02/16/2012 Document Revised: 12/15/2014 Document Reviewed: 01/17/2014 Elsevier Interactive Patient Education [employer]

## 2016-10-30 ENCOUNTER — Encounter: Payer: Self-pay | Admitting: *Deleted

## 2016-10-30 ENCOUNTER — Emergency Department
Admission: EM | Admit: 2016-10-30 | Discharge: 2016-10-30 | Disposition: A | Discharge status: Home / Self Care | Payer: Medicaid Other | Attending: Emergency Medicine | Admitting: Emergency Medicine

## 2016-10-30 ENCOUNTER — Emergency Department: Payer: Medicaid Other

## 2016-10-30 DIAGNOSIS — Y9389 Activity, other specified: Secondary | ICD-10-CM | POA: Insufficient documentation

## 2016-10-30 DIAGNOSIS — S199XXA Unspecified injury of neck, initial encounter: Secondary | ICD-10-CM | POA: Diagnosis present

## 2016-10-30 DIAGNOSIS — Y92410 Unspecified street and highway as the place of occurrence of the external cause: Secondary | ICD-10-CM | POA: Diagnosis not present

## 2016-10-30 DIAGNOSIS — F17210 Nicotine dependence, cigarettes, uncomplicated: Secondary | ICD-10-CM | POA: Diagnosis not present

## 2016-10-30 DIAGNOSIS — Z79899 Other long term (current) drug therapy: Secondary | ICD-10-CM | POA: Insufficient documentation

## 2016-10-30 DIAGNOSIS — M62838 Other muscle spasm: Secondary | ICD-10-CM

## 2016-10-30 DIAGNOSIS — M25562 Pain in left knee: Secondary | ICD-10-CM | POA: Diagnosis not present

## 2016-10-30 DIAGNOSIS — M25561 Pain in right knee: Secondary | ICD-10-CM | POA: Insufficient documentation

## 2016-10-30 DIAGNOSIS — S161XXA Strain of muscle, fascia and tendon at neck level, initial encounter: Secondary | ICD-10-CM | POA: Insufficient documentation

## 2016-10-30 DIAGNOSIS — Y999 Unspecified external cause status: Secondary | ICD-10-CM | POA: Diagnosis not present

## 2016-10-30 LAB — POCT PREGNANCY, URINE: PREG TEST UR: NEGATIVE

## 2016-10-30 MED ORDER — CYCLOBENZAPRINE HCL 10 MG PO TABS
10.0000 mg | ORAL_TABLET | Freq: Once | ORAL | Status: DC
Start: 2016-10-30 — End: 2016-10-30
  Filled 2016-10-30: qty 1

## 2016-10-30 MED ORDER — CYCLOBENZAPRINE HCL 10 MG PO TABS: 10.0000 mg | ORAL_TABLET | Freq: Three times a day (TID) | ORAL | 0 refills | Status: DC | PRN

## 2016-10-30 MED ORDER — NAPROXEN 500 MG PO TABS: 500.0000 mg | ORAL_TABLET | Freq: Two times a day (BID) | ORAL | 0 refills | Status: DC

## 2016-10-30 MED ORDER — KETOROLAC TROMETHAMINE 30 MG/ML IJ SOLN
30.0000 mg | Freq: Once | INTRAMUSCULAR | Status: AC
  Administered 2016-10-30: 30 mg via INTRAMUSCULAR
  Filled 2016-10-30: qty 1
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
## 2016-10-30 NOTE — ED Notes (Signed)
Patient ambulatory to Xray.
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
## 2016-12-08 HISTORY — PX: DILATION AND CURETTAGE OF UTERUS: SHX78

## 2016-12-16 DIAGNOSIS — Z3A00 Weeks of gestation of pregnancy not specified: Secondary | ICD-10-CM | POA: Insufficient documentation

## 2016-12-16 DIAGNOSIS — O209 Hemorrhage in early pregnancy, unspecified: Secondary | ICD-10-CM | POA: Insufficient documentation

## 2016-12-16 DIAGNOSIS — O99330 Smoking (tobacco) complicating pregnancy, unspecified trimester: Secondary | ICD-10-CM | POA: Insufficient documentation

## 2016-12-16 DIAGNOSIS — Z5321 Procedure and treatment not carried out due to patient leaving prior to being seen by health care provider: Secondary | ICD-10-CM | POA: Insufficient documentation

## 2016-12-16 DIAGNOSIS — F17210 Nicotine dependence, cigarettes, uncomplicated: Secondary | ICD-10-CM | POA: Diagnosis not present

## 2016-12-17 ENCOUNTER — Encounter: Payer: Self-pay | Admitting: *Deleted

## 2016-12-17 ENCOUNTER — Emergency Department
Admission: EM | Admit: 2016-12-17 | Discharge: 2016-12-17 | Disposition: A | Discharge status: Home / Self Care | Payer: Medicaid Other | Attending: Emergency Medicine | Admitting: Emergency Medicine

## 2016-12-17 LAB — CBC WITH DIFFERENTIAL/PLATELET
BASOS ABS: 0 10*3/uL (ref 0–0.1)
Basophils Relative: 1 %
EOS PCT: 1 %
Eosinophils Absolute: 0.1 10*3/uL (ref 0–0.7)
HCT: 37.7 % (ref 35.0–47.0)
Hemoglobin: 13 g/dL (ref 12.0–16.0)
LYMPHS PCT: 28 %
Lymphs Abs: 2.1 10*3/uL (ref 1.0–3.6)
MCH: 30.4 pg (ref 26.0–34.0)
MCHC: 34.5 g/dL (ref 32.0–36.0)
MCV: 88.2 fL (ref 80.0–100.0)
Monocytes Absolute: 0.7 10*3/uL (ref 0.2–0.9)
Monocytes Relative: 9 %
NEUTROS ABS: 4.5 10*3/uL (ref 1.4–6.5)
Neutrophils Relative %: 61 %
Platelets: 353 10*3/uL (ref 150–440)
RBC: 4.27 MIL/uL (ref 3.80–5.20)
RDW: 12.8 % (ref 11.5–14.5)
WBC: 7.4 10*3/uL (ref 3.6–11.0)

## 2016-12-17 LAB — BASIC METABOLIC PANEL
ANION GAP: 12 (ref 5–15)
BUN: 6 mg/dL (ref 6–20)
CO2: 21 mmol/L — ABNORMAL LOW (ref 22–32)
Calcium: 9.3 mg/dL (ref 8.9–10.3)
Chloride: 101 mmol/L (ref 101–111)
Creatinine, Ser: 0.61 mg/dL (ref 0.44–1.00)
GFR calc Af Amer: >60 mL/min (ref >60)
GLUCOSE: 90 mg/dL (ref 65–99)
POTASSIUM: 2.9 mmol/L — AB (ref 3.5–5.1)
Sodium: 134 mmol/L — ABNORMAL LOW (ref 135–145)

## 2016-12-17 LAB — URINALYSIS, COMPLETE (UACMP) WITH MICROSCOPIC
GLUCOSE, UA: 50 mg/dL — AB
KETONES UR: 80 mg/dL — AB
LEUKOCYTES UA: NEGATIVE
Nitrite: NEGATIVE
PH: 5 (ref 5.0–8.0)
Protein, ur: 100 mg/dL — AB
Specific Gravity, Urine: 1.032 — ABNORMAL HIGH (ref 1.005–1.030)

## 2016-12-17 LAB — POCT PREGNANCY, URINE: Preg Test, Ur: POSITIVE — AB

## 2016-12-17 LAB — HCG, QUANTITATIVE, PREGNANCY: hCG, Beta Chain, Quant, S: 208843 m[IU]/mL — ABNORMAL HIGH (ref <5)

## 2016-12-17 NOTE — ED Triage Notes (Signed)
Pt reports vag bleeding since yesterday.  Pt states positive home preg test.   Pt has abd cramping.  Pt has nausea. Pt alert.

## 2016-12-18 ENCOUNTER — Emergency Department: Payer: Medicaid Other

## 2016-12-18 ENCOUNTER — Encounter: Payer: Self-pay | Admitting: Emergency Medicine

## 2016-12-18 ENCOUNTER — Telehealth: Payer: Self-pay | Admitting: Emergency Medicine

## 2016-12-18 ENCOUNTER — Emergency Department
Admission: EM | Admit: 2016-12-18 | Discharge: 2016-12-18 | Disposition: A | Discharge status: Home / Self Care | Payer: Medicaid Other | Attending: Emergency Medicine | Admitting: Emergency Medicine

## 2016-12-18 DIAGNOSIS — Z3A08 8 weeks gestation of pregnancy: Secondary | ICD-10-CM | POA: Diagnosis not present

## 2016-12-18 DIAGNOSIS — N939 Abnormal uterine and vaginal bleeding, unspecified: Secondary | ICD-10-CM

## 2016-12-18 DIAGNOSIS — R109 Unspecified abdominal pain: Secondary | ICD-10-CM

## 2016-12-18 DIAGNOSIS — O99331 Smoking (tobacco) complicating pregnancy, first trimester: Secondary | ICD-10-CM | POA: Diagnosis not present

## 2016-12-18 DIAGNOSIS — O209 Hemorrhage in early pregnancy, unspecified: Secondary | ICD-10-CM | POA: Diagnosis present

## 2016-12-18 DIAGNOSIS — O200 Threatened abortion: Secondary | ICD-10-CM | POA: Diagnosis not present

## 2016-12-18 DIAGNOSIS — O26899 Other specified pregnancy related conditions, unspecified trimester: Secondary | ICD-10-CM

## 2016-12-18 DIAGNOSIS — F17210 Nicotine dependence, cigarettes, uncomplicated: Secondary | ICD-10-CM | POA: Diagnosis not present

## 2016-12-18 LAB — HCG, QUANTITATIVE, PREGNANCY: HCG, BETA CHAIN, QUANT, S: 205112 m[IU]/mL — AB (ref <5)

## 2016-12-18 MED ORDER — SODIUM CHLORIDE 0.9 % IV SOLN
Freq: Once | INTRAVENOUS | Status: AC
  Administered 2016-12-18: 13:00:00 via INTRAVENOUS

## 2016-12-18 MED ORDER — POTASSIUM CHLORIDE 20 MEQ/15ML (10%) PO SOLN
40.0000 meq | Freq: Once | ORAL | Status: AC
  Administered 2016-12-18: 40 meq via ORAL
  Filled 2016-12-18: qty 30

## 2016-12-18 MED ORDER — PROMETHAZINE HCL 25 MG/ML IJ SOLN
25.0000 mg | Freq: Once | INTRAMUSCULAR | Status: AC
  Administered 2016-12-18: 25 mg via INTRAVENOUS
  Filled 2016-12-18: qty 1

## 2016-12-18 MED ORDER — PROMETHAZINE HCL 12.5 MG PO TABS: 12.5000 mg | ORAL_TABLET | Freq: Four times a day (QID) | ORAL | 0 refills | Status: DC | PRN

## 2016-12-18 MED ORDER — POTASSIUM CHLORIDE 20 MEQ PO PACK
PACK | ORAL | Status: AC
  Filled 2016-12-18: qty 2

## 2016-12-18 NOTE — ED Provider Notes (Addendum)
[facility] Emergency Department Provider Note   ____________________________________________   None    (approximate)  I have reviewed the triage vital signs and the nursing notes.   HISTORY  Chief Complaint Nausea and Abdominal Cramping    HPI Mikayla Cabrera is a 31 y.o. female who came in yesterday for cramping and bleeding while pregnant. This is her fifth pregnancy. She has 3 children and one child die. She also complains of nausea and inability to eat. Her potassium is low such she was called back. She has not had an ultrasound with this pregnancy and did not stay long enough to get one yesterday. She feels dehydrated she reports.   Past Medical History:  Diagnosis Date  . Anxiety   . Depression     There are no active problems to display for this patient.   Past Surgical History:  Procedure Laterality Date  . TONSILLECTOMY      Prior to Admission medications   Medication Sig Start Date End Date Taking? Authorizing Provider  amoxicillin-clavulanate (AUGMENTIN) 875-125 MG tablet Take 1 tablet by mouth 2 (two) times daily. One po bid x 7 days 02/18/16   [facility], PA-C  Biotin (PA BIOTIN) 1000 MCG tablet Take 2,000 mcg by mouth daily as needed (for vitamin = alternating with L-Lysine).    Historical Provider, MD  citalopram (CELEXA) 10 MG tablet Take 20 mg by mouth daily as needed (for anxiety).    Historical Provider, MD  cyclobenzaprine (FLEXERIL) 10 MG tablet Take 1 tablet (10 mg total) by mouth 3 (three) times daily as needed for muscle spasms. 10/30/16   Jami L Hagler, PA-C  HYDROcodone-acetaminophen (NORCO) 5-325 MG tablet Take 1 tablet by mouth every 6 (six) hours as needed for severe pain. 02/18/16   Mercedes [address] Street, PA-C  L-Lysine 1000 MG TABS Take 1,000 mg by mouth daily as needed (for vitamin= alternating with Biotin).    Historical Provider, MD  naproxen (NAPROSYN) 500 MG tablet Take 1 tablet (500 mg total) by  mouth 2 (two) times daily with a meal. 10/30/16   Jami L Hagler, PA-C  Prenatal Vit-Fe Fumarate-FA (PRENATAL COMPLETE) 14-0.4 MG TABS Take 1 tablet by mouth daily. 09/09/13   Garlon Hatchet, PA-C    Allergies Patient has no known allergies.  No family history on file.  Social History Social History  Substance Use Topics  . Smoking status: Current Some Day Smoker    Types: Cigarettes  . Smokeless tobacco: Never Used  . Alcohol use No    Constitutional: No fever/chills Eyes: No visual changes. ENT: No sore throat. Cardiovascular: Denies chest pain. Respiratory: Denies shortness of breath. Gastrointestinal: No abdominal pain.  No nausea, no vomiting.  No diarrhea.  No constipation. Genitourinary: Negative for dysuria. Musculoskeletal: Negative for back pain. Skin: Negative for rash. Neurological: Negative for headaches, focal weakness or numbness.  10-point ROS otherwise negative.  ____________________________________________   PHYSICAL EXAM:  VITAL SIGNS: ED Triage Vitals [12/18/16 0931]  Enc Vitals Group     BP 106/63     Pulse Rate 75     Resp 20     Temp 98.6 F (37 C)     Temp Source Oral     SpO2 99 %     Weight 174 lb (78.9 kg)     Height      Head Circumference      Peak Flow      Pain Score  Pain Loc      Pain Edu?      Excl. in GC?     Constitutional: Alert and oriented. Well appearing and in no acute distress. Eyes: Conjunctivae are normal. PERRL. EOMI. Head: Atraumatic. Nose: No congestion/rhinnorhea. Mouth/Throat: Mucous membranes are moist.  Oropharynx non-erythematous. Neck: No stridor. Cardiovascular: Normal rate, regular rhythm. Grossly normal heart sounds.  Good peripheral circulation. Respiratory: Normal respiratory effort.  No retractions. Lungs CTAB. Gastrointestinal: Soft and nontender. No distention. No abdominal bruits. No CVA tenderness. Musculoskeletal: No lower extremity tenderness nor edema.  No joint effusions. Neurologic:   Normal speech and language. No gross focal neurologic deficits are appreciated. No gait instability.   ____________________________________________   LABS (all labs ordered are listed, but only abnormal results are displayed)  Labs Reviewed  HCG, QUANTITATIVE, PREGNANCY - Abnormal; Notable for the following:       Result Value   hCG, Beta Chain, Quant, S 205,112 (*)    All other components within normal limits   ____________________________________________  EKG   ____________________________________________  RADIOLOGY Study Result   CLINICAL DATA:  Cramping and vaginal bleeding for 4 days.  EXAM: OBSTETRIC <14 WK [place] AND TRANSVAGINAL OB US  TECHNIQUE: Both transabdominal and transvaginal ultrasound examinations were performed for complete evaluation of the gestation as well as the maternal uterus, adnexal regions, and pelvic cul-de-sac. Transvaginal technique was performed to assess early pregnancy.  COMPARISON:  None.  FINDINGS: Intrauterine gestational sac: Present  Yolk sac:  Present  Embryo:  Present  Cardiac Activity: Present  Heart Rate: 178  bpm  CRL:  21.8  mm   8 w   5 d                  [place] EDC: 07/25/2017  Subchorionic hemorrhage:  None visualized.  Maternal uterus/adnexae:  Right-sided corpus luteum cyst.  Normal left ovary.  IMPRESSION: Single living intrauterine fetus estimated at 8 weeks and 5 days gestation.  No subchorionic hemorrhage.  Normal ovaries.   Electronically Signed   By: Rudie Meyer M.D.   On: 12/18/2016 14:28      ____________________________________________   PROCEDURES  Procedure(s) performed:  Procedures  Critical Care performed:  ____________________________________________   INITIAL IMPRESSION / ASSESSMENT AND PLAN / ED COURSE  Pertinent labs & imaging results that were available during my care of the patient were reviewed by me and considered in my medical decision making  (see chart for details).    Clinical Course      ____________________________________________   FINAL CLINICAL IMPRESSION(S) / ED DIAGNOSES  Final diagnoses:  Threatened miscarriage in early pregnancy      NEW MEDICATIONS STARTED DURING THIS VISIT:  New Prescriptions   No medications on file     Note:  This document was prepared using Dragon voice recognition software and may include unintentional dictation errors.    Arnaldo Natal, MD 12/18/16 1456  Discussed with patient need to return if she is worse. I do not have the room that I can put her in to do a pelvic exam. She says she is just spotting at present and will follow-up with Dr. Feliberto Gottron or [facility] Dr. Feliberto Gottron is on call was signed to she seen before. She will return if she feels worse if she can't keep anything down if she has heavier bleeding or feels sicker at all.   Arnaldo Natal, MD 12/18/16 1509  I should add I asked the patient if she wanted to wait because  I thought I could get a room  soon but she does not want to wait to get the pelvic exam.    Arnaldo NatalPaul F Lucylle Foulkes, MD 12/18/16 1510

## 2016-12-18 NOTE — Telephone Encounter (Signed)
Called patient due to lwot to inquire about condition and follow up plans. The patient had called me yesterday about her labs.  I told her that she really needs exam.  She says she feels dehydrated. Did have diarrhea, but now is unable to have bm.  She has pcp, but says she is coming back here because it is closer than [place].  I told her it would be best to come now for the shortest wait.

## 2016-12-18 NOTE — Discharge Instructions (Signed)
Drink plenty for fluids. Try dry toast and crackers too. Use the phenergan 3 x a day as needed. [facility] Dr Arvella MerlesSchemerhorn.

## 2016-12-18 NOTE — ED Triage Notes (Signed)
Pt returns today for abnormal labs. ED staff notified pt to return to ED. Pt was the other day and could not stay due to the wait times. Pt also reports that she is currenly pregnant, but unsure of delivery date.

## 2016-12-25 ENCOUNTER — Emergency Department
Admission: EM | Admit: 2016-12-25 | Discharge: 2016-12-25 | Disposition: A | Discharge status: Home / Self Care | Payer: Medicaid Other | Attending: Emergency Medicine | Admitting: Emergency Medicine

## 2016-12-25 ENCOUNTER — Encounter: Payer: Self-pay | Admitting: Emergency Medicine

## 2016-12-25 ENCOUNTER — Emergency Department: Payer: Medicaid Other

## 2016-12-25 DIAGNOSIS — E876 Hypokalemia: Secondary | ICD-10-CM | POA: Diagnosis not present

## 2016-12-25 DIAGNOSIS — O208 Other hemorrhage in early pregnancy: Secondary | ICD-10-CM | POA: Diagnosis not present

## 2016-12-25 DIAGNOSIS — F17210 Nicotine dependence, cigarettes, uncomplicated: Secondary | ICD-10-CM | POA: Insufficient documentation

## 2016-12-25 DIAGNOSIS — R102 Pelvic and perineal pain: Secondary | ICD-10-CM | POA: Diagnosis present

## 2016-12-25 DIAGNOSIS — Z3A09 9 weeks gestation of pregnancy: Secondary | ICD-10-CM | POA: Diagnosis not present

## 2016-12-25 DIAGNOSIS — A599 Trichomoniasis, unspecified: Secondary | ICD-10-CM

## 2016-12-25 DIAGNOSIS — O98311 Other infections with a predominantly sexual mode of transmission complicating pregnancy, first trimester: Secondary | ICD-10-CM | POA: Insufficient documentation

## 2016-12-25 DIAGNOSIS — O99331 Smoking (tobacco) complicating pregnancy, first trimester: Secondary | ICD-10-CM | POA: Insufficient documentation

## 2016-12-25 DIAGNOSIS — O418X10 Other specified disorders of amniotic fluid and membranes, first trimester, not applicable or unspecified: Secondary | ICD-10-CM

## 2016-12-25 DIAGNOSIS — O26899 Other specified pregnancy related conditions, unspecified trimester: Secondary | ICD-10-CM

## 2016-12-25 DIAGNOSIS — O468X1 Other antepartum hemorrhage, first trimester: Secondary | ICD-10-CM

## 2016-12-25 DIAGNOSIS — O99281 Endocrine, nutritional and metabolic diseases complicating pregnancy, first trimester: Secondary | ICD-10-CM | POA: Diagnosis not present

## 2016-12-25 DIAGNOSIS — R109 Unspecified abdominal pain: Secondary | ICD-10-CM

## 2016-12-25 DIAGNOSIS — R112 Nausea with vomiting, unspecified: Secondary | ICD-10-CM

## 2016-12-25 DIAGNOSIS — IMO0001 Reserved for inherently not codable concepts without codable children: Secondary | ICD-10-CM

## 2016-12-25 DIAGNOSIS — O209 Hemorrhage in early pregnancy, unspecified: Secondary | ICD-10-CM

## 2016-12-25 LAB — CHLAMYDIA/NGC RT PCR (ARMC ONLY)
Chlamydia Tr: NOT DETECTED
N GONORRHOEAE: NOT DETECTED

## 2016-12-25 LAB — HCG, QUANTITATIVE, PREGNANCY: hCG, Beta Chain, Quant, S: 157169 m[IU]/mL — ABNORMAL HIGH (ref <5)

## 2016-12-25 LAB — BASIC METABOLIC PANEL
ANION GAP: 12 (ref 5–15)
BUN: 8 mg/dL (ref 6–20)
CO2: 20 mmol/L — ABNORMAL LOW (ref 22–32)
Calcium: 9.6 mg/dL (ref 8.9–10.3)
Chloride: 102 mmol/L (ref 101–111)
Creatinine, Ser: 0.54 mg/dL (ref 0.44–1.00)
GFR calc Af Amer: >60 mL/min (ref >60)
GLUCOSE: 103 mg/dL — AB (ref 65–99)
POTASSIUM: 2.7 mmol/L — AB (ref 3.5–5.1)
Sodium: 134 mmol/L — ABNORMAL LOW (ref 135–145)

## 2016-12-25 LAB — CBC WITH DIFFERENTIAL/PLATELET
BASOS ABS: 0 10*3/uL (ref 0–0.1)
Basophils Relative: 1 %
Eosinophils Absolute: 0.1 10*3/uL (ref 0–0.7)
Eosinophils Relative: 1 %
HEMATOCRIT: 39.7 % (ref 35.0–47.0)
Hemoglobin: 13.8 g/dL (ref 12.0–16.0)
Lymphocytes Relative: 29 %
Lymphs Abs: 2.2 10*3/uL (ref 1.0–3.6)
MCH: 30.1 pg (ref 26.0–34.0)
MCHC: 34.7 g/dL (ref 32.0–36.0)
MCV: 86.9 fL (ref 80.0–100.0)
Monocytes Absolute: 0.9 10*3/uL (ref 0.2–0.9)
Monocytes Relative: 12 %
NEUTROS ABS: 4.4 10*3/uL (ref 1.4–6.5)
Neutrophils Relative %: 57 %
Platelets: 358 10*3/uL (ref 150–440)
RBC: 4.57 MIL/uL (ref 3.80–5.20)
RDW: 12.6 % (ref 11.5–14.5)
WBC: 7.6 10*3/uL (ref 3.6–11.0)

## 2016-12-25 LAB — WET PREP, GENITAL
Clue Cells Wet Prep HPF POC: NONE SEEN
Sperm: NONE SEEN
YEAST WET PREP: NONE SEEN

## 2016-12-25 LAB — ABO/RH: ABO/RH(D): B POS

## 2016-12-25 LAB — GLUCOSE, CAPILLARY: GLUCOSE-CAPILLARY: 121 mg/dL — AB (ref 65–99)

## 2016-12-25 MED ORDER — METRONIDAZOLE 500 MG PO TABS
2000.0000 mg | ORAL_TABLET | Freq: Once | ORAL | Status: AC
  Administered 2016-12-25: 2000 mg via ORAL
  Filled 2016-12-25: qty 4

## 2016-12-25 MED ORDER — POTASSIUM CHLORIDE CRYS ER 20 MEQ PO TBCR
40.0000 meq | EXTENDED_RELEASE_TABLET | Freq: Once | ORAL | Status: AC
  Administered 2016-12-25: 40 meq via ORAL
  Filled 2016-12-25: qty 2

## 2016-12-25 MED ORDER — VITAMIN B-6 25 MG PO TABS: 25.0000 mg | ORAL_TABLET | Freq: Three times a day (TID) | ORAL | 0 refills | Status: DC | PRN

## 2016-12-25 MED ORDER — ACETAMINOPHEN 325 MG PO TABS
ORAL_TABLET | ORAL | Status: AC
  Filled 2016-12-25: qty 2

## 2016-12-25 MED ORDER — METOCLOPRAMIDE HCL 5 MG PO TABS: 5.0000 mg | ORAL_TABLET | Freq: Three times a day (TID) | ORAL | 0 refills | Status: DC | PRN

## 2016-12-25 MED ORDER — ACETAMINOPHEN 325 MG PO TABS
650.0000 mg | ORAL_TABLET | Freq: Once | ORAL | Status: AC
  Administered 2016-12-25: 650 mg via ORAL

## 2016-12-25 MED ORDER — ONDANSETRON 4 MG PO TBDP
4.0000 mg | ORAL_TABLET | Freq: Once | ORAL | Status: AC
  Administered 2016-12-25: 4 mg via ORAL
  Filled 2016-12-25: qty 1

## 2016-12-25 NOTE — Discharge Instructions (Signed)
Please drink plenty of fluid to stay well hydrated.  Please make a follow up appointment with your OB-Gyn.  Have your OB-Gyn recheck your potassium level, and follow up your gonorrhea and chlamydia testing.    Return to the emergency department for lightheadedness or fainting, fever, severe pain, palpitations or chest pain, or any other symptoms concerning to you.

## 2016-12-25 NOTE — ED Triage Notes (Signed)
Pt to triage via w/c with no distress noted, brought in by EMS; pt reports seen here 1/11 for vag bleeding; u/s indicated approx [redacted]wks pregnant; G5P3; has not been seen by OB yet; st vag bleeding persists, now with clots and lower abd cramping; pt also reports having yellow vag discharge and is concerned about getting a pelvic exam since she did not get one last visit

## 2016-12-25 NOTE — ED Provider Notes (Signed)
[facility] Emergency Department Provider Note  ____________________________________________  Time seen: Approximately 3:15 AM  I have reviewed the triage vital signs and the nursing notes.   HISTORY  Chief Complaint Vaginal Bleeding    HPI Mikayla Cabrera is a 31 y.o. female G5P3A1 approximately [redacted] weeks pregnant presenting w/ vaginal bleeding and lower abdominal cramping.  She was evaluated on 1/10 and 1/11 for vaginal bleeding, at which time she was found to have an intrauterine pregnancy measuring 8 weeks 5 days. She was sent home with precautions regarding threatened abortion. Her bleeding completely stopped until yesterday when she developed bleeding with clots but no obvious passage of tissue. She also had associated lower abdominal cramping with some low back pain. No fevers or chills, lightheadedness or syncope.   Past Medical History:  Diagnosis Date  . Anxiety   . Depression     There are no active problems to display for this patient.   Past Surgical History:  Procedure Laterality Date  . TONSILLECTOMY      Current Outpatient Rx  . Order #: [id number] Class: Print  . Order #: [id number] Class: Historical Med  . Order #: [id number] Class: Historical Med  . Order #: [id number] Class: Print  . Order #: [id number] Class: Print  . Order #: [id number] Class: Historical Med  . Order #: [id number] Class: Print  . Order #: [id number] Class: Print  . Order #: [id number] Class: Print  . Order #: [id number] Class: Print  . Order #: [id number] Class: Print    Allergies Patient has no known allergies.  No family history on file.  Social History Social History  Substance Use Topics  . Smoking status: Current Some Day Smoker    Types: Cigarettes  . Smokeless tobacco: Never Used  . Alcohol use No    Review of Systems Constitutional: No fever/chills.No lightheadedness or syncope. Eyes: No visual changes. ENT: No sore throat. No congestion or  rhinorrhea. Cardiovascular: Denies chest pain. Denies palpitations. Respiratory: Denies shortness of breath.  No cough. Gastrointestinal: Positive lower abdominal cramping .  No nausea, no vomiting.  No diarrhea.  No constipation. Genitourinary: Negative for dysuria. Positive vaginal bleeding and passage of clots. Musculoskeletal: Negative for back pain. Skin: Negative for rash. Neurological: Negative for headaches. No focal numbness, tingling or weakness.   10-point ROS otherwise negative.  ____________________________________________   PHYSICAL EXAM:  VITAL SIGNS: ED Triage Vitals [12/25/16 0132]  Enc Vitals Group     BP 116/78     Pulse Rate 99     Resp 20     Temp 98.5 F (36.9 C)     Temp Source Oral     SpO2 100 %     Weight 170 lb (77.1 kg)     Height 5\' 1"  (1.549 m)     Head Circumference      Peak Flow      Pain Score 7     Pain Loc      Pain Edu?      Excl. in GC?     Constitutional: Alert and oriented. Well appearing and in no acute distress. Answers questions appropriately. Eyes: Conjunctivae are normal.  EOMI. No scleral icterus. Head: Atraumatic. Nose: No congestion/rhinnorhea. Mouth/Throat: Mucous membranes are moist.  Neck: No stridor.  Supple.   Cardiovascular: Normal rate, regular rhythm. No murmurs, rubs or gallops.  Respiratory: Normal respiratory effort.  No accessory muscle use or retractions. Lungs CTAB.  No wheezes, rales or ronchi. Gastrointestinal: Soft, nontender and nondistended.  No guarding or rebound.  No peritoneal  signs. Genitourinary: Normal-appearing external genitalia without lesions. Vaginal exam with yellowish discharge, normal-appearing cervix, normal vaginal wall tissue. Bimanual exam is negative for CMT, + mild L adnexal tenderness to palpation, no palpable masses; uterus full. Cervix is not palpable due to pt inability to tolerate exam. Musculoskeletal: No LE edema.  Neurologic:  A&Ox3.  Speech is clear.  Face and smile are  symmetric.  EOMI.  Moves all extremities well. Skin:  Skin is warm, dry and intact. No rash noted. Psychiatric: Mood and affect are normal. Speech and behavior are normal.  Normal judgement.  ____________________________________________   LABS (all labs ordered are listed, but only abnormal results are displayed)  Labs Reviewed  WET PREP, GENITAL - Abnormal; Notable for the following:       Result Value   Trich, Wet Prep PRESENT (*)    WBC, Wet Prep HPF POC FEW (*)    All other components within normal limits  BASIC METABOLIC PANEL - Abnormal; Notable for the following:    Sodium 134 (*)    Potassium 2.7 (*)    CO2 20 (*)    Glucose, Bld 103 (*)    All other components within normal limits  HCG, QUANTITATIVE, PREGNANCY - Abnormal; Notable for the following:    hCG, Beta Chain, Quant, S 157,169 (*)    All other components within normal limits  GLUCOSE, CAPILLARY - Abnormal; Notable for the following:    Glucose-Capillary 121 (*)    All other components within normal limits  CHLAMYDIA/NGC RT PCR ([facility] ONLY)  CBC WITH DIFFERENTIAL/PLATELET  ABO/RH   ____________________________________________  EKG  Not indicated ____________________________________________  RADIOLOGY  [place] Ob Comp Less 14 Wks  Result Date: 12/25/2016 CLINICAL DATA:  31 y/o F; pregnant patient with 1 day of vaginal bleeding. EXAM: OBSTETRIC <14 WK US AND TRANSVAGINAL OB US TECHNIQUE: Both transabdominal and transvaginal ultrasound examinations were performed for complete evaluation of the gestation as well as the maternal uterus, adnexal regions, and pelvic cul-de-sac. Transvaginal technique was performed to assess early pregnancy. COMPARISON:  12/18/2016 pelvic ultrasound. FINDINGS: Intrauterine gestational sac: Single Yolk sac:  Visualized. Embryo:  Visualized. Cardiac Activity: Visualized. Heart Rate: 178  bpm CRL:  28.5  mm   9 w   5 d                  US EDC: 08/25/2017 Subchorionic hemorrhage:  Small subchorionic hemorrhage measuring 0.9 x 0.5 x 1.0 cm. Maternal uterus/adnexae: Normal. IMPRESSION: Single live intrauterine pregnancy with estimated gestational age of [redacted] weeks and 5 days, appropriate growth from prior ultrasound. Small subchorionic hemorrhage. Electronically Signed   By: Mitzi HansenLance  Furusawa-Stratton M.D.   On: 12/25/2016 03:04   [place] Ob Transvaginal  Result Date: 12/25/2016 CLINICAL DATA:  31 y/o F; pregnant patient with 1 day of vaginal bleeding. EXAM: OBSTETRIC <14 WK US AND TRANSVAGINAL OB US TECHNIQUE: Both transabdominal and transvaginal ultrasound examinations were performed for complete evaluation of the gestation as well as the maternal uterus, adnexal regions, and pelvic cul-de-sac. Transvaginal technique was performed to assess early pregnancy. COMPARISON:  12/18/2016 pelvic ultrasound. FINDINGS: Intrauterine gestational sac: Single Yolk sac:  Visualized. Embryo:  Visualized. Cardiac Activity: Visualized. Heart Rate: 178  bpm CRL:  28.5  mm   9 w   5 d                  US EDC: 08/25/2017 Subchorionic hemorrhage: Small subchorionic hemorrhage measuring 0.9 x 0.5 x 1.0 cm. Maternal uterus/adnexae: Normal. IMPRESSION:  Single live intrauterine pregnancy with estimated gestational age of [redacted] weeks and 5 days, appropriate growth from prior ultrasound. Small subchorionic hemorrhage. Electronically Signed   By: Mitzi Hansen M.D.   On: 12/25/2016 03:04    ____________________________________________   PROCEDURES  Procedure(s) performed: None  Procedures  Critical Care performed: No ____________________________________________   INITIAL IMPRESSION / ASSESSMENT AND PLAN / ED COURSE  Pertinent labs & imaging results that were available during my care of the patient were reviewed by me and considered in my medical decision making (see chart for details).  31 y.o. G5P3A1 approximately [redacted] weeks pregnant presenting with vaginal bleeding and lower abdominal cramping. The  patient has reassuring vital signs, and reassuring examination. We'll do a pelvic exam, and I'm awaiting the results of her ultrasound. She is Rh+ so Rh[place]ham is not indicated today. She has a stable hemoglobin and hematocrit.  The patient has significant hypokalemia, which she has had in the past. She states that she has been having a significant amount of vomiting, with an 8 pound weight loss and unable to keep anything down. I will plan to treat her with an antiemetic, give her K Dur, and have her get her potassium rechecked by her primary care physician or OB/GYN.  ----------------------------------------- 3:22 AM on 12/25/2016 -----------------------------------------  The patient's ultrasound does show a live IUP measuring 9 weeks 5 days with a normal fetal heart rate. I will complete her pelvic examination and anticipate discharge home with close OB/GYN follow-up.  ____________________________________________  FINAL CLINICAL IMPRESSION(S) / ED DIAGNOSES  Final diagnoses:  Subchorionic hemorrhage of placenta in first trimester, single or unspecified fetus  Vaginal bleeding in pregnancy, first trimester  Abdominal cramping affecting pregnancy  Hypokalemia  Intractable vomiting with nausea, unspecified vomiting type         NEW MEDICATIONS STARTED DURING THIS VISIT:  New Prescriptions   METOCLOPRAMIDE (REGLAN) 5 MG TABLET    Take 1 tablet (5 mg total) by mouth every 8 (eight) hours as needed for nausea or vomiting.   PYRIDOXINE (PYRIDOXINE) 25 MG TABLET    Take 1 tablet (25 mg total) by mouth every 8 (eight) hours as needed.      Rockne Menghini, MD 12/25/16 [phone]

## 2017-07-20 ENCOUNTER — Encounter (HOSPITAL_COMMUNITY): Payer: Self-pay | Admitting: *Deleted

## 2017-07-20 DIAGNOSIS — L0231 Cutaneous abscess of buttock: Secondary | ICD-10-CM | POA: Insufficient documentation

## 2017-07-20 DIAGNOSIS — R2240 Localized swelling, mass and lump, unspecified lower limb: Secondary | ICD-10-CM | POA: Diagnosis present

## 2017-07-20 DIAGNOSIS — F17210 Nicotine dependence, cigarettes, uncomplicated: Secondary | ICD-10-CM | POA: Diagnosis not present

## 2017-07-20 DIAGNOSIS — Z79899 Other long term (current) drug therapy: Secondary | ICD-10-CM | POA: Insufficient documentation

## 2017-07-20 NOTE — ED Triage Notes (Signed)
Pt c/o abscess to left buttock x 2 days

## 2017-07-21 ENCOUNTER — Emergency Department (HOSPITAL_COMMUNITY)
Admission: EM | Admit: 2017-07-21 | Discharge: 2017-07-21 | Disposition: A | Discharge status: Home / Self Care | Payer: Medicaid Other | Attending: Emergency Medicine | Admitting: Emergency Medicine

## 2017-07-21 DIAGNOSIS — L0291 Cutaneous abscess, unspecified: Secondary | ICD-10-CM

## 2017-07-21 MED ORDER — LIDOCAINE HCL (PF) 1 % IJ SOLN
5.0000 mL | Freq: Once | INTRAMUSCULAR | Status: DC
  Filled 2017-07-21: qty 5

## 2017-07-21 MED ORDER — HYDROCODONE-ACETAMINOPHEN 5-325 MG PO TABS
1.0000 | ORAL_TABLET | Freq: Once | ORAL | Status: AC
  Administered 2017-07-21: 1 via ORAL
  Filled 2017-07-21: qty 1

## 2017-07-21 MED ORDER — POVIDONE-IODINE 10 % EX SOLN
CUTANEOUS | Status: AC
  Filled 2017-07-21: qty 15

## 2017-07-21 MED ORDER — LIDOCAINE-EPINEPHRINE (PF) 1 %-1:200000 IJ SOLN
10.0000 mL | Freq: Once | INTRAMUSCULAR | Status: AC
  Administered 2017-07-21: 30 mL via INTRADERMAL

## 2017-07-21 MED ORDER — LIDOCAINE-EPINEPHRINE (PF) 1 %-1:200000 IJ SOLN
INTRAMUSCULAR | Status: AC
  Administered 2017-07-21: 30 mL via INTRADERMAL
  Filled 2017-07-21: qty 30

## 2017-07-21 NOTE — Discharge Instructions (Signed)
You were seen today for an abscess of the left buttock. This was drained and packed. There is no overlying signs of infection. Use a pad in her underwear to collect any drainage. Follow-up with your primary physician or return for packing removal and wound check in 2 days. If you develop fevers or any new worsening symptoms you should be reevaluated.

## 2017-07-21 NOTE — ED Provider Notes (Signed)
[facility]-EMERGENCY DEPT Provider Note   CSN: [id number] Arrival date & time: 07/20/17  2302     History   Chief Complaint Chief Complaint  Patient presents with  . Abscess    HPI Mikayla Cabrera is a 31 y.o. female.  HPI  This is a 31 year old female who presents with of abscess to the left buttock. Patient reports 2 day history of worsening pain and swelling over the left buttock. She reports that she's never had an abscess before. She has not noted any drainage. Currently her pain is 7 out of 10. It is worse with sitting. She reports chills. No documented fevers. She does recently report "shaving everything" which seems to have made her have more "pimples."  Past Medical History:  Diagnosis Date  . Anxiety   . Depression     There are no active problems to display for this patient.   Past Surgical History:  Procedure Laterality Date  . TONSILLECTOMY      OB History    No data available       Home Medications    Prior to Admission medications   Medication Sig Start Date End Date Taking? Authorizing Provider  Biotin (PA BIOTIN) 1000 MCG tablet Take 2,000 mcg by mouth daily as needed (for vitamin = alternating with L-Lysine).    [provider]  citalopram (CELEXA) 10 MG tablet Take 20 mg by mouth daily as needed (for anxiety).    [provider]  cyclobenzaprine (FLEXERIL) 10 MG tablet Take 1 tablet (10 mg total) by mouth 3 (three) times daily as needed for muscle spasms. 10/30/16   Hagler, Jami L, PA-C  HYDROcodone-acetaminophen (NORCO) 5-325 MG tablet Take 1 tablet by mouth every 6 (six) hours as needed for severe pain. 02/18/16   Street, Mercedes, PA-C  L-Lysine 1000 MG TABS Take 1,000 mg by mouth daily as needed (for vitamin= alternating with Biotin).    [provider]  metoCLOPramide (REGLAN) 5 MG tablet Take 1 tablet (5 mg total) by mouth every 8 (eight) hours as needed for nausea or vomiting. 12/25/16 12/25/17  Rockne Menghini, MD   naproxen (NAPROSYN) 500 MG tablet Take 1 tablet (500 mg total) by mouth 2 (two) times daily with a meal. 10/30/16   Hagler, Jami L, PA-C  Prenatal Vit-Fe Fumarate-FA (PRENATAL COMPLETE) 14-0.4 MG TABS Take 1 tablet by mouth daily. 09/09/13   Garlon Hatchet, PA-C  promethazine (PHENERGAN) 12.5 MG tablet Take 1 tablet (12.5 mg total) by mouth every 6 (six) hours as needed for nausea or vomiting. 12/18/16   Arnaldo Natal, MD  pyridOXINE (PYRIDOXINE) 25 MG tablet Take 1 tablet (25 mg total) by mouth every 8 (eight) hours as needed. 12/25/16   Rockne Menghini, MD    Family History History reviewed. No pertinent family history.  Social History Social History  Substance Use Topics  . Smoking status: Current Some Day Smoker    Types: Cigarettes  . Smokeless tobacco: Never Used  . Alcohol use No     Allergies   Patient has no known allergies.   Review of Systems Review of Systems  Constitutional: Positive for chills. Negative for fever.  Skin:       Abscess  All other systems reviewed and are negative.    Physical Exam Updated Vital Signs BP 124/71 (BP Location: Right Arm)   Pulse 85   Temp 97.8 F (36.6 C) (Oral)   Resp 18   Ht 5' 1.5" (1.562 m)   Wt  77.1 kg (170 lb)   LMP 07/11/2017   SpO2 100%   BMI 31.60 kg/m   Physical Exam  Constitutional: She is oriented to person, place, and time. She appears well-developed and well-nourished.  HENT:  Head: Normocephalic and atraumatic.  Cardiovascular: Normal rate and regular rhythm.   Pulmonary/Chest: Effort normal. No respiratory distress.  Genitourinary:     Genitourinary Comments: 1 x 2 cm area of induration and fluctuance in her left buttock just adjacent to gluteal cleft, no overlying skin changes or erythema  Neurological: She is alert and oriented to person, place, and time.  Skin: Skin is warm and dry.  Psychiatric: She has a normal mood and affect.  Nursing note and vitals reviewed.    ED Treatments /  Results  Labs (all labs ordered are listed, but only abnormal results are displayed) Labs Reviewed - No data to display  EKG  EKG Interpretation None       Radiology No results found.  Procedures Procedures (including critical care time)  INCISION AND DRAINAGE Performed by: Ross MarcusHORTON, COURTNEY F Consent: Verbal consent obtained. Risks and benefits: risks, benefits and alternatives were discussed Type: abscess  Body area: buttock  Anesthesia: local infiltration  Incision was made with a scalpel.  Local anesthetic: lidocaine 2% w epinephrine  Anesthetic total: 5 ml  Complexity: complex Blunt dissection to break up loculations  Drainage: purulent  Drainage amount: moderate  Packing material: 1/4 in iodoform gauze  Patient tolerance: Patient tolerated the procedure well with no immediate complications.     Medications Ordered in ED Medications  lidocaine-EPINEPHrine (XYLOCAINE-EPINEPHrine) 1 %-1:200000 (PF) injection 10 mL (not administered)  povidone-iodine (BETADINE) 10 % external solution (not administered)  HYDROcodone-acetaminophen (NORCO/VICODIN) 5-325 MG per tablet 1 tablet (1 tablet Oral Given 07/21/17 0050)  lidocaine-EPINEPHrine (XYLOCAINE-EPINEPHrine) 1 %-1:200000 (PF) injection (30 mLs  Given 07/21/17 0058)     Initial Impression / Assessment and Plan / ED Course  I have reviewed the triage vital signs and the nursing notes.  Pertinent labs & imaging results that were available during my care of the patient were reviewed by me and considered in my medical decision making (see chart for details).     A short presents with simple abscess to the left buttock. No signs or symptoms of cellulitis. Abscess was drained and packed. Wound recheck in 2 days with packing removal. No indication for antibiotics at this time.  After history, exam, and medical workup I feel the patient has been appropriately medically screened and is safe for discharge home.  Pertinent diagnoses were discussed with the patient. Patient was given return precautions.   Final Clinical Impressions(s) / ED Diagnoses   Final diagnoses:  Abscess    New Prescriptions New Prescriptions   No medications on file     Shon BatonHorton, Courtney F, MD 07/21/17 [id number]

## 2017-11-04 ENCOUNTER — Emergency Department (HOSPITAL_COMMUNITY)
Admission: EM | Admit: 2017-11-04 | Discharge: 2017-11-05 | Disposition: A | Discharge status: Home / Self Care | Payer: Medicaid Other | Attending: Emergency Medicine | Admitting: Emergency Medicine

## 2017-11-04 ENCOUNTER — Encounter (HOSPITAL_COMMUNITY): Payer: Self-pay | Admitting: *Deleted

## 2017-11-04 ENCOUNTER — Emergency Department (HOSPITAL_COMMUNITY): Payer: Medicaid Other

## 2017-11-04 ENCOUNTER — Other Ambulatory Visit: Payer: Self-pay

## 2017-11-04 DIAGNOSIS — A599 Trichomoniasis, unspecified: Secondary | ICD-10-CM | POA: Insufficient documentation

## 2017-11-04 DIAGNOSIS — Z3A08 8 weeks gestation of pregnancy: Secondary | ICD-10-CM | POA: Diagnosis not present

## 2017-11-04 DIAGNOSIS — O468X1 Other antepartum hemorrhage, first trimester: Secondary | ICD-10-CM

## 2017-11-04 DIAGNOSIS — Z79899 Other long term (current) drug therapy: Secondary | ICD-10-CM | POA: Insufficient documentation

## 2017-11-04 DIAGNOSIS — F17210 Nicotine dependence, cigarettes, uncomplicated: Secondary | ICD-10-CM | POA: Insufficient documentation

## 2017-11-04 DIAGNOSIS — Z3481 Encounter for supervision of other normal pregnancy, first trimester: Secondary | ICD-10-CM | POA: Diagnosis not present

## 2017-11-04 DIAGNOSIS — B9689 Other specified bacterial agents as the cause of diseases classified elsewhere: Secondary | ICD-10-CM | POA: Insufficient documentation

## 2017-11-04 DIAGNOSIS — O209 Hemorrhage in early pregnancy, unspecified: Secondary | ICD-10-CM | POA: Diagnosis present

## 2017-11-04 DIAGNOSIS — O418X10 Other specified disorders of amniotic fluid and membranes, first trimester, not applicable or unspecified: Secondary | ICD-10-CM | POA: Diagnosis not present

## 2017-11-04 DIAGNOSIS — N760 Acute vaginitis: Secondary | ICD-10-CM | POA: Insufficient documentation

## 2017-11-04 LAB — CBC WITH DIFFERENTIAL/PLATELET
BASOS ABS: 0 10*3/uL (ref 0.0–0.1)
BASOS PCT: 0 %
EOS ABS: 0.1 10*3/uL (ref 0.0–0.7)
EOS PCT: 1 %
HCT: 37.9 % (ref 36.0–46.0)
Hemoglobin: 12.5 g/dL (ref 12.0–15.0)
Lymphocytes Relative: 35 %
Lymphs Abs: 2.5 10*3/uL (ref 0.7–4.0)
MCH: 29.6 pg (ref 26.0–34.0)
MCHC: 33 g/dL (ref 30.0–36.0)
MCV: 89.8 fL (ref 78.0–100.0)
MONO ABS: 0.4 10*3/uL (ref 0.1–1.0)
Monocytes Relative: 5 %
Neutro Abs: 4.2 10*3/uL (ref 1.7–7.7)
Neutrophils Relative %: 59 %
PLATELETS: 351 10*3/uL (ref 150–400)
RBC: 4.22 MIL/uL (ref 3.87–5.11)
RDW: 12.8 % (ref 11.5–15.5)
WBC: 7.1 10*3/uL (ref 4.0–10.5)

## 2017-11-04 LAB — I-STAT BETA HCG BLOOD, ED (MC, WL, AP ONLY)

## 2017-11-04 LAB — HCG, QUANTITATIVE, PREGNANCY: HCG, BETA CHAIN, QUANT, S: 127832 m[IU]/mL — AB (ref <5)

## 2017-11-04 NOTE — ED Triage Notes (Signed)
Vaginal bleeding since Sunday  Her lmp was  October 1st  She has had a pos preg test.  After the bleeding the pain started

## 2017-11-04 NOTE — ED Provider Notes (Signed)
[facility] Provider Note   CSN: [id number] Arrival date & time: 11/04/17  1709     History   Chief Complaint Chief Complaint  Patient presents with  . Vaginal Bleeding    HPI Mikayla GurneyCierrah N Cabrera is a 31 y.o. female.  HPI  31 y.o. female, presents to the Emergency Department today due to vaginal bleeding since Sunday. Notes spotting initially, but increased gradually today. Notes changing pads x 2 on Monday and x 3 today. No passage of products of conception. Pt took home pregnancy test and was positive. LMP Oct. 1st. Denies dizziness or lightheadedness. No N/V.  No fevers. Notes mild abdominal discomfort. Rates 2/10. Cramping. Denies dysuria. Notes vaginal discharge with odor. Has had previous miscarriage in January. Pt sexually active with spouse. Intercourse on Saturday. No CP/SOB. No other symptoms noted  Past Medical History:  Diagnosis Date  . Anxiety   . Depression     There are no active problems to display for this patient.   Past Surgical History:  Procedure Laterality Date  . TONSILLECTOMY      OB History    No data available       Home Medications    Prior to Admission medications   Medication Sig Start Date End Date Taking? Authorizing Provider  Biotin (PA BIOTIN) 1000 MCG tablet Take 2,000 mcg by mouth daily as needed (for vitamin = alternating with L-Lysine).    [provider]  citalopram (CELEXA) 10 MG tablet Take 20 mg by mouth daily as needed (for anxiety).    [provider]  cyclobenzaprine (FLEXERIL) 10 MG tablet Take 1 tablet (10 mg total) by mouth 3 (three) times daily as needed for muscle spasms. 10/30/16   Hagler, Jami L, PA-C  HYDROcodone-acetaminophen (NORCO) 5-325 MG tablet Take 1 tablet by mouth every 6 (six) hours as needed for severe pain. 02/18/16   Street, Mercedes, PA-C  L-Lysine 1000 MG TABS Take 1,000 mg by mouth daily as needed (for vitamin= alternating with Biotin).     [provider]  metoCLOPramide (REGLAN) 5 MG tablet Take 1 tablet (5 mg total) by mouth every 8 (eight) hours as needed for nausea or vomiting. 12/25/16 12/25/17  Rockne MenghiniNorman, Anne-Caroline, MD  naproxen (NAPROSYN) 500 MG tablet Take 1 tablet (500 mg total) by mouth 2 (two) times daily with a meal. 10/30/16   Hagler, Jami L, PA-C  Prenatal Vit-Fe Fumarate-FA (PRENATAL COMPLETE) 14-0.4 MG TABS Take 1 tablet by mouth daily. 09/09/13   Garlon HatchetSanders, Lisa M, PA-C  promethazine (PHENERGAN) 12.5 MG tablet Take 1 tablet (12.5 mg total) by mouth every 6 (six) hours as needed for nausea or vomiting. 12/18/16   Arnaldo NatalMalinda, Paul F, MD  pyridOXINE (PYRIDOXINE) 25 MG tablet Take 1 tablet (25 mg total) by mouth every 8 (eight) hours as needed. 12/25/16   Rockne MenghiniNorman, Anne-Caroline, MD    Family History No family history on file.  Social History Social History   Tobacco Use  . Smoking status: Current Some Day Smoker    Types: Cigarettes  . Smokeless tobacco: Never Used  Substance Use Topics  . Alcohol use: No  . Drug use: No     Allergies   Patient has no known allergies.   Review of Systems Review of Systems ROS reviewed and all are negative for acute change except as noted in the HPI.  Physical Exam Updated Vital Signs BP 125/66 (BP Location: Right Arm)   Pulse 84   Temp 98.1 F (  36.7 C) (Oral)   Resp 16   Ht 5' 1.5" (1.562 m)   Wt 81.6 kg (180 lb)   LMP 09/07/2017   SpO2 98%   BMI 33.46 kg/m   Physical Exam  Constitutional: She is oriented to person, place, and time. She appears well-developed and well-nourished. No distress.  HENT:  Head: Normocephalic and atraumatic.  Right Ear: Tympanic membrane, external ear and ear canal normal.  Left Ear: Tympanic membrane, external ear and ear canal normal.  Nose: Nose normal.  Mouth/Throat: Uvula is midline, oropharynx is clear and moist and mucous membranes are normal. No trismus in the jaw. No oropharyngeal exudate, posterior oropharyngeal  erythema or tonsillar abscesses.  Eyes: EOM are normal. Pupils are equal, round, and reactive to light.  Neck: Normal range of motion. Neck supple. No tracheal deviation present.  Cardiovascular: Normal rate, regular rhythm, S1 normal, S2 normal, normal heart sounds, intact distal pulses and normal pulses.  Pulmonary/Chest: Effort normal and breath sounds normal. No respiratory distress. She has no decreased breath sounds. She has no wheezes. She has no rhonchi. She has no rales.  Abdominal: Normal appearance and bowel sounds are normal. There is no tenderness. There is no rigidity, no rebound, no guarding, no CVA tenderness, no tenderness at McBurney's point and negative Murphy's sign.  Musculoskeletal: Normal range of motion.  Neurological: She is alert and oriented to person, place, and time.  Skin: Skin is warm and dry.  Psychiatric: She has a normal mood and affect. Her speech is normal and behavior is normal. Thought content normal.  Nursing note and vitals reviewed.  Exam performed by Eston Esters,  exam chaperoned Date: 11/04/2017 Pelvic exam: normal external genitalia without evidence of trauma. VULVA: normal appearing vulva with no masses, tenderness or lesion. VAGINA: normal appearing vagina with normal color and discharge, no lesions. CERVIX: normal appearing cervix without lesions, cervical motion tenderness absent, cervical os closed with out purulent discharge; vaginal discharge - copious and green, Wet prep and DNA probe for chlamydia and GC obtained.   ADNEXA: normal adnexa in size, nontender and no masses UTERUS: uterus is normal size, shape, consistency and nontender.    ED Treatments / Results  Labs (all labs ordered are listed, but only abnormal results are displayed) Labs Reviewed  WET PREP, GENITAL - Abnormal; Notable for the following components:      Result Value   Trich, Wet Prep PRESENT (*)    Clue Cells Wet Prep HPF POC PRESENT (*)    WBC, Wet Prep HPF POC  MANY (*)    All other components within normal limits  HCG, QUANTITATIVE, PREGNANCY - Abnormal; Notable for the following components:   hCG, Beta Francene Finders 295,284 (*)    All other components within normal limits  I-STAT BETA HCG BLOOD, ED ([facility], [facility], [facility] ONLY) - Abnormal; Notable for the following components:   I-stat hCG, quantitative >2,000.0 (*)    All other components within normal limits  CBC WITH DIFFERENTIAL/PLATELET  POC URINE PREG, ED  ABO/RH  GC/CHLAMYDIA PROBE AMP ([place]) NOT AT [facility]    EKG  EKG Interpretation None       Radiology US Ob Comp < 14 Wks  Result Date: 11/05/2017 CLINICAL DATA:  Vaginal bleeding EXAM: OBSTETRIC <14 WK ULTRASOUND TECHNIQUE: Transabdominal ultrasound was performed for evaluation of the gestation as well as the maternal uterus and adnexal regions. COMPARISON:  None. FINDINGS: Intrauterine gestational sac: Single intrauterine gestation Yolk sac:  Visible Embryo:  Visible Cardiac Activity: Visible Heart Rate: 163 bpm CRL:   20.3  mm   8 w 4 d                  US EDC: 06/12/2017 Subchorionic hemorrhage: Small subchorionic hemorrhage along the anterior aspect of the sac. Maternal uterus/adnexae: Ovaries are within normal limits. The right ovary measures 3.1 x 2.9 x 4.8 cm. The left ovary measures 4.2 x 1.9 x 2 cm. No significant free fluid. IMPRESSION: Single viable intrauterine pregnancy as above. Small subchorionic hemorrhage. Electronically Signed   By: Jasmine PangKim  Fujinaga M.D.   On: 11/05/2017 00:26    Procedures Procedures (including critical care time)  Medications Ordered in ED Medications - No data to display   Initial Impression / Assessment and Plan / ED Course  I have reviewed the triage vital signs and the nursing notes.  Pertinent labs & imaging results that were available during my care of the patient were reviewed by me and considered in my medical decision making (see chart for details).  Final Clinical Impressions(s) / ED  Diagnoses  {I have reviewed and evaluated the relevant laboratory values. {I have reviewed and evaluated the relevant imaging studies.  {I have reviewed the relevant previous healthcare records.  {I obtained HPI from historian.   ED Course:  Assessment: Pt is a 31 y.o. female presents to the Emergency Department today due to vaginal bleeding since Sunday. Notes spotting initially, but increased gradually today. Notes changing pads x 2 on Monday and x 3 today. No passage of products of conception. Pt took home pregnancy test and was positive. LMP Oct. 1st. Denies dizziness or lightheadedness. No N/V.  No fevers. Notes mild abdominal discomfort. Rates 2/10. Cramping. Denies dysuria. Notes vaginal discharge with odor. Has had previous miscarriage in January. Pt sexually active with spouse. Intercourse on Saturday. No CP/SOB. On exam, pt in NAD. Nontoxic/nonseptic appearing. VSS. Afebrile. Lungs CTA. Heart RRR. Abdomen nontender soft. GU Exam with green tinted discharge noted. No Adnexal. No CMT. HCG quant [id number]. CBC WNL. RH Positive. OB Pelvic US shows IUP around 8 weeks. Small subchorionic hemorrhage. Wet prep shows BW, trich, and WBCs. GC obtained. Will treat with Flagyl, Rocephin, and Azithro.Plan is to DC home with follow up to OBGYN. Started on Prenatal vitamins. At time of discharge, Patient is in no acute distress. Vital Signs are stable. Patient is able to ambulate. Patient able to tolerate PO.   Disposition/Plan:  DC Home Additional Verbal discharge instructions given and discussed with patient.  Pt Instructed to f/u with OBGYN in the next week for evaluation and treatment of symptoms. Return precautions given Pt acknowledges and agrees with plan  Supervising Physician Rancour, Jeannett SeniorStephen, MD  Final diagnoses:  Subchorionic hemorrhage of placenta in first trimester, single or unspecified fetus  [redacted] weeks gestation of pregnancy  BV (bacterial vaginosis)  Trichomoniasis    ED Discharge Orders     None       Audry PiliMohr, Raygen Linquist, PA-C 11/05/17 0132    Glynn Octaveancour, Stephen, MD 11/05/17 [phone]

## 2017-11-05 LAB — WET PREP, GENITAL
SPERM: NONE SEEN
YEAST WET PREP: NONE SEEN

## 2017-11-05 LAB — GC/CHLAMYDIA PROBE AMP (~~LOC~~) NOT AT ARMC
Chlamydia: NEGATIVE
Neisseria Gonorrhea: NEGATIVE

## 2017-11-05 MED ORDER — CEFTRIAXONE SODIUM 250 MG IJ SOLR
250.0000 mg | Freq: Once | INTRAMUSCULAR | Status: AC
Start: 2017-11-05 — End: 2017-11-05
  Administered 2017-11-05: 250 mg via INTRAMUSCULAR
  Filled 2017-11-05: qty 250

## 2017-11-05 MED ORDER — AZITHROMYCIN 250 MG PO TABS
1000.0000 mg | ORAL_TABLET | Freq: Once | ORAL | Status: AC
  Administered 2017-11-05: 1000 mg via ORAL
  Filled 2017-11-05: qty 4

## 2017-11-05 MED ORDER — STERILE WATER FOR INJECTION IJ SOLN
INTRAMUSCULAR | Status: AC
  Filled 2017-11-05: qty 10

## 2017-11-05 MED ORDER — METRONIDAZOLE 500 MG PO TABS
500.0000 mg | ORAL_TABLET | Freq: Once | ORAL | Status: AC
  Administered 2017-11-05: 500 mg via ORAL
  Filled 2017-11-05: qty 1

## 2017-11-05 MED ORDER — PRENATAL COMPLETE 14-0.4 MG PO TABS: 0.4000 mg | ORAL_TABLET | Freq: Every day | ORAL | 0 refills | Status: DC

## 2017-11-05 MED ORDER — METRONIDAZOLE 500 MG PO TABS: 500.0000 mg | ORAL_TABLET | Freq: Two times a day (BID) | ORAL | 0 refills | Status: DC

## 2017-11-06 LAB — ABO/RH: ABO/RH(D): B POS

## 2019-04-04 ENCOUNTER — Emergency Department (HOSPITAL_COMMUNITY)
Admission: EM | Admit: 2019-04-04 | Discharge: 2019-04-04 | Disposition: A | Discharge status: Home / Self Care | Payer: Medicaid Other | Attending: Emergency Medicine | Admitting: Emergency Medicine

## 2019-04-04 ENCOUNTER — Encounter (HOSPITAL_COMMUNITY): Payer: Self-pay | Admitting: Emergency Medicine

## 2019-04-04 ENCOUNTER — Other Ambulatory Visit: Payer: Self-pay

## 2019-04-04 DIAGNOSIS — S161XXA Strain of muscle, fascia and tendon at neck level, initial encounter: Secondary | ICD-10-CM | POA: Insufficient documentation

## 2019-04-04 DIAGNOSIS — F17210 Nicotine dependence, cigarettes, uncomplicated: Secondary | ICD-10-CM | POA: Diagnosis not present

## 2019-04-04 DIAGNOSIS — S199XXA Unspecified injury of neck, initial encounter: Secondary | ICD-10-CM | POA: Diagnosis present

## 2019-04-04 DIAGNOSIS — Y92410 Unspecified street and highway as the place of occurrence of the external cause: Secondary | ICD-10-CM | POA: Insufficient documentation

## 2019-04-04 DIAGNOSIS — Y999 Unspecified external cause status: Secondary | ICD-10-CM | POA: Diagnosis not present

## 2019-04-04 DIAGNOSIS — Z79899 Other long term (current) drug therapy: Secondary | ICD-10-CM | POA: Insufficient documentation

## 2019-04-04 DIAGNOSIS — Y9389 Activity, other specified: Secondary | ICD-10-CM | POA: Insufficient documentation

## 2019-04-04 MED ORDER — NAPROXEN 500 MG PO TABS: 500.0000 mg | ORAL_TABLET | Freq: Two times a day (BID) | ORAL | 0 refills | Status: DC

## 2019-04-04 MED ORDER — METHOCARBAMOL 500 MG PO TABS: 1000.0000 mg | ORAL_TABLET | Freq: Three times a day (TID) | ORAL | 0 refills | Status: AC
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
## 2019-04-04 NOTE — ED Notes (Signed)
Bed: WA02 Expected date:  Expected time:  Means of arrival:  Comments: 
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
## 2020-05-03 ENCOUNTER — Encounter (HOSPITAL_COMMUNITY): Payer: Self-pay

## 2020-05-03 ENCOUNTER — Emergency Department (HOSPITAL_COMMUNITY)
Admission: EM | Admit: 2020-05-03 | Discharge: 2020-05-04 | Disposition: A | Discharge status: Home / Self Care | Payer: Medicaid Other | Attending: Emergency Medicine | Admitting: Emergency Medicine

## 2020-05-03 ENCOUNTER — Ambulatory Visit (HOSPITAL_COMMUNITY)
Admission: EM | Admit: 2020-05-03 | Discharge: 2020-05-03 | Disposition: A | Discharge status: Home / Self Care | Payer: Medicaid Other | Attending: Family Medicine | Admitting: Family Medicine

## 2020-05-03 ENCOUNTER — Other Ambulatory Visit: Payer: Self-pay

## 2020-05-03 DIAGNOSIS — R519 Headache, unspecified: Secondary | ICD-10-CM

## 2020-05-03 DIAGNOSIS — Z3A00 Weeks of gestation of pregnancy not specified: Secondary | ICD-10-CM | POA: Insufficient documentation

## 2020-05-03 DIAGNOSIS — O99350 Diseases of the nervous system complicating pregnancy, unspecified trimester: Secondary | ICD-10-CM | POA: Insufficient documentation

## 2020-05-03 DIAGNOSIS — G43109 Migraine with aura, not intractable, without status migrainosus: Secondary | ICD-10-CM | POA: Diagnosis not present

## 2020-05-03 DIAGNOSIS — Z3201 Encounter for pregnancy test, result positive: Secondary | ICD-10-CM

## 2020-05-03 DIAGNOSIS — O99330 Smoking (tobacco) complicating pregnancy, unspecified trimester: Secondary | ICD-10-CM | POA: Diagnosis not present

## 2020-05-03 DIAGNOSIS — F17210 Nicotine dependence, cigarettes, uncomplicated: Secondary | ICD-10-CM | POA: Insufficient documentation

## 2020-05-03 DIAGNOSIS — R8271 Bacteriuria: Secondary | ICD-10-CM | POA: Insufficient documentation

## 2020-05-03 DIAGNOSIS — H53452 Other localized visual field defect, left eye: Secondary | ICD-10-CM

## 2020-05-03 LAB — I-STAT BETA HCG BLOOD, ED (MC, WL, AP ONLY): I-stat hCG, quantitative: >2000 m[IU]/mL — ABNORMAL HIGH (ref <5)

## 2020-05-03 LAB — URINALYSIS, ROUTINE W REFLEX MICROSCOPIC
Bilirubin Urine: NEGATIVE
Glucose, UA: NEGATIVE mg/dL
Hgb urine dipstick: NEGATIVE
Ketones, ur: NEGATIVE mg/dL
Nitrite: NEGATIVE
Protein, ur: NEGATIVE mg/dL
Specific Gravity, Urine: 1.006 (ref 1.005–1.030)
Trans Epithel, UA: 7
pH: 7 (ref 5.0–8.0)

## 2020-05-03 LAB — COMPREHENSIVE METABOLIC PANEL
ALT: 13 U/L (ref 0–44)
AST: 12 U/L — ABNORMAL LOW (ref 15–41)
Albumin: 3.8 g/dL (ref 3.5–5.0)
Alkaline Phosphatase: 52 U/L (ref 38–126)
Anion gap: 7 (ref 5–15)
BUN: <5 mg/dL — ABNORMAL LOW (ref 6–20)
CO2: 24 mmol/L (ref 22–32)
Calcium: 9.1 mg/dL (ref 8.9–10.3)
Chloride: 106 mmol/L (ref 98–111)
Creatinine, Ser: 0.64 mg/dL (ref 0.44–1.00)
GFR calc Af Amer: >60 mL/min (ref >60)
GFR calc non Af Amer: >60 mL/min (ref >60)
Glucose, Bld: 97 mg/dL (ref 70–99)
Potassium: 3.8 mmol/L (ref 3.5–5.1)
Sodium: 137 mmol/L (ref 135–145)
Total Bilirubin: 0.5 mg/dL (ref 0.3–1.2)
Total Protein: 6.6 g/dL (ref 6.5–8.1)

## 2020-05-03 LAB — CBC
HCT: 38.8 % (ref 36.0–46.0)
Hemoglobin: 12.3 g/dL (ref 12.0–15.0)
MCH: 29.6 pg (ref 26.0–34.0)
MCHC: 31.7 g/dL (ref 30.0–36.0)
MCV: 93.5 fL (ref 80.0–100.0)
Platelets: 380 10*3/uL (ref 150–400)
RBC: 4.15 MIL/uL (ref 3.87–5.11)
RDW: 13.2 % (ref 11.5–15.5)
WBC: 9 10*3/uL (ref 4.0–10.5)
nRBC: 0 % (ref 0.0–0.2)

## 2020-05-03 LAB — LIPASE, BLOOD: Lipase: 34 U/L (ref 11–51)

## 2020-05-03 MED ORDER — SODIUM CHLORIDE 0.9% FLUSH: 3.0000 mL | Freq: Once | INTRAVENOUS | Status: DC

## 2020-05-03 NOTE — ED Provider Notes (Signed)
[facility] EMERGENCY DEPARTMENT Provider Note   CSN: [id number] Arrival date & time: 05/03/20  1602     History Chief Complaint  Patient presents with   Blurred Vision   Headache    Mikayla Cabrera is a 34 y.o. female with a history of insomnia, anxiety, and depression who presents to the emergency department with a chief complaint of headache.  The patient reports that she has been having a dull, frontal headache for the last 2 days.  She also reports persistent nausea for the last few days as well as some left-sided, constant tinnitus for several months that she describes as " feeling as if someone is blowing in her ear"  Reports that she developed an abrupt, severe, right-sided parietal headache that felt like pressure this morning.  Approximately 11:30, she reports that she was sitting in class and looked to her left and noticed that her vision seemed "pixelated" in her left visual field and was accompanied by pain with looking to the left.  She reports that this was reproducible on several repeat attempts.  She then went to lunch and noticed that her food did not taste the same as usual and it felt more spicy so she skipped lunch.  Then, at 1:30, she became hot and diaphoretic and the headache became increasingly worse.  She states at that time that she looked down in the visual field in her left eye seemed pixelated again.  Then, she lost vision entirely in the left eye for approximately 30 seconds to a minute.  At that time, she became concerned and was taken to urgent care by her boyfriend.  She was then advised to come to the emergency department for further work-up and evaluation of her symptoms.  Reports that she uses a birth control patch that must be changed weekly.  Reports that she was changing the patch weekly until she began to have her period on May 8.  Reports that her.  Seemed regular and lasted for several days.  Reports that she has not had  unprotected sex since the end of April.  She does note that since her last menstrual cycle that she has run out of the birth control patches and is supposed to follow-up with her OB/GYN tomorrow.  No numbness, weakness, chest pain, shortness of breath, vomiting, diarrhea, abdominal pain, cramping, vaginal discharge or bleeding, dysuria, slurred speech.  The history is provided by the patient. No language interpreter was used.       Past Medical History:  Diagnosis Date   Anxiety    Depression     There are no problems to display for this patient.   Past Surgical History:  Procedure Laterality Date   TONSILLECTOMY       OB History   No obstetric history on file.     No family history on file.  Social History   Tobacco Use   Smoking status: Current Some Day Smoker    Types: Cigarettes   Smokeless tobacco: Never Used  Substance Use Topics   Alcohol use: No   Drug use: No    Home Medications Prior to Admission medications   Medication Sig Start Date End Date Taking? Authorizing Provider  cephALEXin (KEFLEX) 500 MG capsule Take 1 capsule (500 mg total) by mouth 4 (four) times daily for 5 days. 05/04/20 05/09/20  Karley Pho A, PA-C  metoCLOPramide (REGLAN) 5 MG tablet Take 1 tablet (5 mg total) by mouth every 8 (eight) hours as  needed for nausea or vomiting. 05/04/20   Zoi Devine A, PA-C  Prenatal Vit-Fe Fumarate-FA (PRENATAL COMPLETE) 14-0.4 MG TABS Take 0.4 mg by mouth daily. 05/04/20   Lee-Anne Flicker A, PA-C  promethazine (PHENERGAN) 12.5 MG tablet Take 1 tablet (12.5 mg total) by mouth every 6 (six) hours as needed for nausea or vomiting. Patient not taking: Reported on 05/04/2020 12/18/16 05/04/20  Arnaldo NatalMalinda, Paul F, MD    Allergies    Patient has no known allergies.  Review of Systems   Review of Systems  Constitutional: Negative for activity change, chills and fever.  HENT: Positive for tinnitus. Negative for sinus pressure and sinus pain.   Eyes: Positive  for photophobia, pain and visual disturbance. Negative for redness and itching.  Respiratory: Negative for shortness of breath.   Cardiovascular: Negative for chest pain.  Gastrointestinal: Positive for nausea. Negative for abdominal pain, constipation, diarrhea and vomiting.  Genitourinary: Negative for dysuria, flank pain, frequency, urgency, vaginal discharge and vaginal pain.  Musculoskeletal: Negative for arthralgias, back pain, myalgias, neck pain and neck stiffness.  Skin: Negative for rash.  Allergic/Immunologic: Negative for immunocompromised state.  Neurological: Positive for dizziness and headaches. Negative for syncope, weakness, light-headedness and numbness.  Psychiatric/Behavioral: Negative for confusion.    Physical Exam Updated Vital Signs BP 94/63    Pulse 82    Temp 98 F (36.7 C) (Oral)    Resp 16    Ht 5\' 2"  (1.575 m)    Wt 81.6 kg    SpO2 98%    BMI 32.90 kg/m   Physical Exam Vitals and nursing note reviewed.  Constitutional:      General: She is not in acute distress.    Appearance: She is not ill-appearing, toxic-appearing or diaphoretic.  HENT:     Head: Normocephalic.  Eyes:     Extraocular Movements: Extraocular movements intact.     Conjunctiva/sclera: Conjunctivae normal.     Pupils: Pupils are equal, round, and reactive to light.     Comments: Bilateral complete consensual photophobia.  Cardiovascular:     Rate and Rhythm: Normal rate and regular rhythm.     Heart sounds: No murmur. No friction rub. No gallop.   Pulmonary:     Effort: Pulmonary effort is normal. No respiratory distress.     Breath sounds: No stridor. No wheezing, rhonchi or rales.  Chest:     Chest wall: No tenderness.  Abdominal:     General: There is no distension.     Palpations: Abdomen is soft. There is no mass.     Tenderness: There is no abdominal tenderness. There is no right CVA tenderness, left CVA tenderness, guarding or rebound.     Hernia: No hernia is present.    Musculoskeletal:     Cervical back: Neck supple.     Right lower leg: No edema.     Left lower leg: No edema.  Skin:    General: Skin is warm.     Findings: No rash.  Neurological:     Mental Status: She is alert.     Comments: Cranial nerves II through XII are grossly intact.  Alert and oriented x4.  Moves all 4 extremities spontaneously.  GCS 15.  5-5 strength against resistance of the bilateral upper and lower extremities.  Mildly decreased sensation to the left peroneal nerve and left ulnar nerve distributions when compared to the right.  Gait is not ataxic.  Normal heel and toe walking.  No dysmetria finger-to-nose bilaterally.  Heel-to-shin is normal bilaterally.  Negative Romberg.  No pronator drift.  2-3 beats of clonus on the left as compared to 2 on the right.  No cogwheeling.  Normal muscle tone.  Psychiatric:        Behavior: Behavior normal.     ED Results / Procedures / Treatments   Labs (all labs ordered are listed, but only abnormal results are displayed) Labs Reviewed  COMPREHENSIVE METABOLIC PANEL - Abnormal; Notable for the following components:      Result Value   BUN <5 (*)    AST 12 (*)    All other components within normal limits  URINALYSIS, ROUTINE W REFLEX MICROSCOPIC - Abnormal; Notable for the following components:   APPearance CLOUDY (*)    Leukocytes,Ua LARGE (*)    Bacteria, UA FEW (*)    All other components within normal limits  HCG, QUANTITATIVE, PREGNANCY - Abnormal; Notable for the following components:   hCG, Beta Chain, Quant, S 13,236 (*)    All other components within normal limits  I-STAT BETA HCG BLOOD, ED ([facility], [facility], [facility] ONLY) - Abnormal; Notable for the following components:   I-stat hCG, quantitative >2,000.0 (*)    All other components within normal limits  URINE CULTURE  LIPASE, BLOOD  CBC    EKG None  Radiology MR ANGIO HEAD WO CONTRAST  Result Date: 05/04/2020 CLINICAL DATA:  Acute onset of blurred vision in the left eye and  headache beginning yesterday morning. EXAM: MRI HEAD WITHOUT CONTRAST MRA HEAD WITHOUT CONTRAST TECHNIQUE: Multiplanar, multiecho pulse sequences of the brain and surrounding structures were obtained without intravenous contrast. Angiographic images of the head were obtained using MRA technique without contrast. COMPARISON:  CT head without contrast 02/18/2016 FINDINGS: MRI HEAD FINDINGS Brain: No acute infarct, hemorrhage, or mass lesion is present. No significant white matter lesions are present. The ventricles are of normal size. No significant extraaxial fluid collection is present. The internal auditory canals are within normal limits. The brainstem and cerebellum are within normal limits. Vascular: Flow is present in the major intracranial arteries. Skull and upper cervical spine: The craniocervical junction is normal. Upper cervical spine is within normal limits. Marrow signal is unremarkable. Sinuses/Orbits: The paranasal sinuses and mastoid air cells are clear. The globes and orbits are within normal limits. MRA HEAD FINDINGS The internal carotid arteries are within normal limits from the high cervical segments through the ICA termini bilaterally. The A1 and M1 segments are normal. MCA bifurcations are intact. The ACA and MCA branch vessels are normal. The left vertebral artery is the dominant vessel. Left PICA origin is visualized and normal. Right PICA is not visualized. Basilar artery is normal. Right posterior cerebral artery originates from the basilar tip. The left posterior cerebral artery is of fetal type. PCA branch vessels are within normal limits bilaterally. IMPRESSION: 1. Normal MRI appearance of the brain. No acute or focal lesion to explain headaches or visual changes. 2. Normal MRA Circle of Willis without evidence for significant proximal stenosis, aneurysm, or branch vessel occlusion. Electronically Signed   By: Marin Roberts M.D.   On: 05/04/2020 05:21   MR ANGIO NECK WO  CONTRAST  Result Date: 05/04/2020 CLINICAL DATA:  Headache, sudden, carotid/vertebral dissection suspected. Sudden onset of blurred vision. EXAM: MRA NECK WITHOUT CONTRAST TECHNIQUE: Multiplanar and multiecho pulse sequences of the neck were obtained without intravenous contrast. Angiographic images of the neck were obtained using MRA technique without intravenous contrast. COMPARISON:  None. FINDINGS: Time-of-flight images demonstrate no  significant focal stenosis or flow disturbance to either carotid artery. The bifurcations are intact. Cervical ICA is normal bilaterally. Antegrade flow is present in the vertebral arteries bilaterally. IMPRESSION: Negative MRA of the neck. Electronically Signed   By: Marin Roberts M.D.   On: 05/04/2020 07:23   MR BRAIN WO CONTRAST  Result Date: 05/04/2020 CLINICAL DATA:  Acute onset of blurred vision in the left eye and headache beginning yesterday morning. EXAM: MRI HEAD WITHOUT CONTRAST MRA HEAD WITHOUT CONTRAST TECHNIQUE: Multiplanar, multiecho pulse sequences of the brain and surrounding structures were obtained without intravenous contrast. Angiographic images of the head were obtained using MRA technique without contrast. COMPARISON:  CT head without contrast 02/18/2016 FINDINGS: MRI HEAD FINDINGS Brain: No acute infarct, hemorrhage, or mass lesion is present. No significant white matter lesions are present. The ventricles are of normal size. No significant extraaxial fluid collection is present. The internal auditory canals are within normal limits. The brainstem and cerebellum are within normal limits. Vascular: Flow is present in the major intracranial arteries. Skull and upper cervical spine: The craniocervical junction is normal. Upper cervical spine is within normal limits. Marrow signal is unremarkable. Sinuses/Orbits: The paranasal sinuses and mastoid air cells are clear. The globes and orbits are within normal limits. MRA HEAD FINDINGS The internal  carotid arteries are within normal limits from the high cervical segments through the ICA termini bilaterally. The A1 and M1 segments are normal. MCA bifurcations are intact. The ACA and MCA branch vessels are normal. The left vertebral artery is the dominant vessel. Left PICA origin is visualized and normal. Right PICA is not visualized. Basilar artery is normal. Right posterior cerebral artery originates from the basilar tip. The left posterior cerebral artery is of fetal type. PCA branch vessels are within normal limits bilaterally. IMPRESSION: 1. Normal MRI appearance of the brain. No acute or focal lesion to explain headaches or visual changes. 2. Normal MRA Circle of Willis without evidence for significant proximal stenosis, aneurysm, or branch vessel occlusion. Electronically Signed   By: Marin Roberts M.D.   On: 05/04/2020 05:21   MR MRV HEAD WO CM  Result Date: 05/04/2020 CLINICAL DATA:  Acute onset of headache and blurred vision involving the left eye. EXAM: MR VENOGRAM OF THE HEAD WITHOUT CONTRAST TECHNIQUE: Angiographic images of the intracranial venous structures were obtained using MRV technique without intravenous contrast. COMPARISON:  MR head 05/04/20 FINDINGS: Dural sinuses are patent. The right transverse sinus is hypoplastic. Left transverse and sigmoid sinus are dominant. The straight sinus deep cerebral veins are patent. Cortical veins are unremarkable. Superficial veins drain into the non dominant right sigmoid sinus. IMPRESSION: Negative MRV of the head. Electronically Signed   By: Marin Roberts M.D.   On: 05/04/2020 05:24    Procedures Procedures (including critical care time)  Medications Ordered in ED Medications  sodium chloride flush (NS) 0.9 % injection 3 mL (0 mLs Intravenous Hold 05/03/20 2255)  metoCLOPramide (REGLAN) injection 10 mg (10 mg Intravenous Given 05/04/20 0132)  diphenhydrAMINE (BENADRYL) injection 25 mg (25 mg Intravenous Given 05/04/20 0132)     ED Course  I have reviewed the triage vital signs and the nursing notes.  Pertinent labs & imaging results that were available during my care of the patient were reviewed by me and considered in my medical decision making (see chart for details).    MDM Rules/Calculators/A&P  34 year old female with a history of insomnia, anxiety, and depression presenting with headache, nausea, amaurosis fugax in the left eye, photophobia, and left eye pain.  Vital signs are normal.  On evaluation, she does have decreased sensation to sharp and light touch to the left peroneal nerve and left ulnar nerve distributions when compared to the right.  She does have 2-3 beats of clonus bilaterally.  There is consensual photophobia bilaterally. No other focal neurologic findings.  The patient was seen and independently evaluated by Dr. Bebe Shaggy, attending physician.  He performed bedside transabdominal ultrasound ultrasound an intrauterine pregnancy was identified after patient had positive i-STAT hCG pregnancy test.  Quantitative hCG pregnancy with 13,236.  She is having no abdominal or pelvic complaints as well as vaginal bleeding or discharge at this time.  Will defer formal pelvic ultrasound at this time.  Given headache with neurologic changes in the setting of pregnancy, neurology was consulted.  Spoke with Dr. Amada Jupiter.  Although symptoms are thought to be likely secondary to complicated migraine, given that patient is pregnant he recommends MR brain without contrast, MR angio head and neck without contrast, and MRV head without contrast.  These have been ordered.  UA with leukocyte esterase and some bacteria.  Will send urine culture.  Patient was treated with Reglan and Benadryl and on reevaluation, headache has resolved.  Imaging is negative.  Patient has no other complaints at this time.  Will discharge patient with Keflex for asymptomatic bacteria, prenatal vitamin, and Reglan.  She  has been instructed to follow-up with OB/GYN.  All questions answered.  She is safe for discharge home with outpatient follow-up.  ER return precautions given.  Final Clinical Impression(s) / ED Diagnoses Final diagnoses:  Complicated migraine  Positive pregnancy test  Asymptomatic bacteriuria    Rx / DC Orders ED Discharge Orders         Ordered    metoCLOPramide (REGLAN) 5 MG tablet  Every 8 hours PRN     05/04/20 0657    Prenatal Vit-Fe Fumarate-FA (PRENATAL COMPLETE) 14-0.4 MG TABS  Daily     05/04/20 0657    cephALEXin (KEFLEX) 500 MG capsule  4 times daily     05/04/20 0720           Alan Riles A, PA-C 05/04/20 3818    Zadie Rhine, MD 05/05/20 [phone]

## 2020-05-03 NOTE — ED Triage Notes (Signed)
Pt reports she started having left eye blurred vision today at class around 11:30 am and throbbing headache. Pt denies any history of migraines or vision changes in the pass.

## 2020-05-03 NOTE — ED Provider Notes (Signed)
[facility]   [id number] 05/03/20 Arrival Time: 1428  ASSESSMENT & PLAN:  1. Acute intractable headache, unspecified headache type   2. Abnormal peripheral vision of left eye     Given no h/o headaches with abrupt headache and vision changes I recommend ED evaluation. She agrees. Stable upon discharge. By private vehicle.   Recommend: Follow-up Information    Go to  [facility] EMERGENCY DEPARTMENT.   Specialty: Emergency Medicine Contact information: [address] [id number] [address]he[place] Bon[place] [address] [phone]           Reviewed expectations re: course of current medical issues. Questions answered. Outlined signs and symptoms indicating need for more acute intervention. Patient verbalized understanding. After Visit Summary given.   SUBJECTIVE: History from: Patient Patient is able to give a clear and coherent history.  Mikayla Cabrera is a 34 y.o. female who presents with complaint of persistent left sided peripheral vision change described as blurriness. Noted this approx 2-3 hours ago while sitting in class. Followed shortly by an abrupt frontal and occipital headache that is also persisting. No h/o headaches. No head or eye injury. Does not wear contact lenses. Dull left eye pain also reported. No frank vision loss reported. Has been well otherwise. No recent illnesses. Afebrile. No n/v. Ambulatory without difficulty. No extremity sensation changes or weakness.  No new medications.    OBJECTIVE:  Vitals:   05/03/20 1516  BP: 117/67  Pulse: 77  Resp: 18  Temp: 98.4 F (36.9 C)  TempSrc: Oral  SpO2: 100%    General appearance: alert; NAD but appears anxious HENT: normocephalic; atraumatic Eyes: PERRLA; EOMI; conjunctivae normal; reports blurry peripheral vision on the left only Neck: supple with FROM Lungs: unlabored respirations Extremities: no edema Skin: warm and dry Neurologic: alert; speech is  fluent and clear without dysarthria or aphasia Psychological: alert and cooperative; normal mood and affect   No Known Allergies  Past Medical History:  Diagnosis Date  . Anxiety   . Depression    Social History   Socioeconomic History  . Marital status: Single    Spouse name: Not on file  . Number of children: Not on file  . Years of education: Not on file  . Highest education level: Not on file  Occupational History  . Not on file  Tobacco Use  . Smoking status: Current Some Day Smoker    Types: Cigarettes  . Smokeless tobacco: Never Used  Substance and Sexual Activity  . Alcohol use: No  . Drug use: No  . Sexual activity: Not on file  Other Topics Concern  . Not on file  Social History Narrative  . Not on file   Social Determinants of Health   Financial Resource Strain:   . Difficulty of Paying Living Expenses:   Food Insecurity:   . Worried About Programme researcher, broadcasting/film/video in the Last Year:   . Barista in the Last Year:   Transportation Needs:   . Freight forwarder (Medical):   Marland Kitchen Lack of Transportation (Non-Medical):   Physical Activity:   . Days of Exercise per Week:   . Minutes of Exercise per Session:   Stress:   . Feeling of Stress :   Social Connections:   . Frequency of Communication with Friends and Family:   . Frequency of Social Gatherings with Friends and Family:   . Attends Religious Services:   . Active Member of Clubs or Organizations:   .  Attends Archivist Meetings:   Marland Kitchen Marital Status:   Intimate Partner Violence:   . Fear of Current or Ex-Partner:   . Emotionally Abused:   Marland Kitchen Physically Abused:   . Sexually Abused:    No family history on file. Past Surgical History:  Procedure Laterality Date  . Evonnie Dawes, MD 05/03/20 [phone]

## 2020-05-03 NOTE — ED Triage Notes (Signed)
Pt here for further eval of pixilated vision in periphery of L eye while in class this morning at 1130. Headache began afterwards. Now endorses lightheadedness, nausea, and photophobia.

## 2020-05-04 ENCOUNTER — Emergency Department (HOSPITAL_COMMUNITY): Payer: Medicaid Other

## 2020-05-04 LAB — HCG, QUANTITATIVE, PREGNANCY: hCG, Beta Chain, Quant, S: 13236 m[IU]/mL — ABNORMAL HIGH (ref <5)

## 2020-05-04 MED ORDER — METOCLOPRAMIDE HCL 5 MG PO TABS: 5.0000 mg | ORAL_TABLET | Freq: Three times a day (TID) | ORAL | 0 refills | Status: AC | PRN

## 2020-05-04 MED ORDER — FOSFOMYCIN TROMETHAMINE 3 G PO PACK: 3.0000 g | PACK | Freq: Once | ORAL | Status: DC

## 2020-05-04 MED ORDER — PRENATAL COMPLETE 14-0.4 MG PO TABS: 0.4000 mg | ORAL_TABLET | Freq: Every day | ORAL | 0 refills | Status: DC

## 2020-05-04 MED ORDER — DIPHENHYDRAMINE HCL 50 MG/ML IJ SOLN
25.0000 mg | Freq: Once | INTRAMUSCULAR | Status: AC
  Administered 2020-05-04: 25 mg via INTRAVENOUS
  Filled 2020-05-04: qty 1

## 2020-05-04 MED ORDER — CEPHALEXIN 500 MG PO CAPS: 500.0000 mg | ORAL_CAPSULE | Freq: Four times a day (QID) | ORAL | 0 refills | Status: AC

## 2020-05-04 MED ORDER — METOCLOPRAMIDE HCL 5 MG/ML IJ SOLN
10.0000 mg | Freq: Once | INTRAMUSCULAR | Status: AC
  Administered 2020-05-04: 10 mg via INTRAVENOUS
  Filled 2020-05-04: qty 2

## 2020-05-04 NOTE — Discharge Instructions (Addendum)
Thank you for allowing me to care for you today in the Emergency Department.   You were seen today for a headache with changes in your vision, change in taste, and numbness.  Your symptoms resolved while you are in the ER.  The imaging of your head did not show any acute findings.  This was thought to be due to a complicated migraine.  You can take Reglan as prescribed and Benadryl if your headache returns.  Follow-up with primary care if he continued to have headaches.  You can also take 650 mg of Tylenol once every 6 hours.  You should avoid NSAIDs, which include medication such as ibuprofen and naproxen.  You had a positive pregnancy test in the ER.  Please follow-up with OB/GYN.  You should return to the emergency department if you develop abdominal pain or cramping, vaginal bleeding, or vaginal discharge.  I have also given you a prescription for a prenatal vitamin.  Your urine had bacteria in it.  Take 1 tablet of Keflex every 6 hours for the next 5 days.  You can have this rechecked at your OB/GYN's office.  Return to the emergency department if you develop a severe headache, persistent changes in your vision, new numbness or weakness, slurred speech, abdominal pain or cramping, vaginal bleeding or discharge, or other new, concerning symptoms.

## 2020-05-04 NOTE — ED Provider Notes (Signed)
Patient seen/examined in the Emergency Department in conjunction with Advanced Practice Provider McDonald Patient reports headache and visual changes, now resolved.  Pt found to be pregnant Exam : awake/alert, no distress.  No arm or leg drift.  No focal abdominal tenderness Plan: consult neuro.  Pt found to be pregnant.  Bedside US reveals very early IUP, 1st trimester  Ultrasound ED OB Pelvic  Date/Time: 05/04/2020 12:01 AM Performed by: Zadie Rhine, MD Authorized by: Zadie Rhine, MD   Procedure details:    Indications: evaluate for IUP     Technique:  Transabdominal obstetric (HCG+) exam   Images: archived   Study Limitations: body habitus Uterine findings:    Intrauterine pregnancy: identified            Zadie Rhine, MD 05/04/20 0001

## 2020-05-04 NOTE — ED Notes (Signed)
Patient Alert and oriented to baseline. Stable and ambulatory to baseline. Patient verbalized understanding of the discharge instructions.  Patient belongings were taken by the patient.   

## 2020-05-04 NOTE — ED Notes (Signed)
Pt transported to MRI at this time 

## 2020-05-05 LAB — URINE CULTURE: Culture: 10000 — AB

## 2020-10-16 ENCOUNTER — Ambulatory Visit: Payer: Medicaid Other | Admitting: Obstetrics

## 2020-10-22 ENCOUNTER — Ambulatory Visit: Payer: Medicaid Other | Admitting: Advanced Practice Midwife

## 2020-10-22 NOTE — Progress Notes (Deleted)
Subjective:     Mikayla Cabrera is a 34 y.o. female here at [facility] *** for a routine exam.  Current complaints: ***.  Personal health questionnaire reviewed: {yes/no:9010}.  Do you have a primary care provider? *** Do you feel safe at home? ***    Risk factors for chronic health problems: Smoking: Alchohol/how much: Pt BMI: There is no height or weight on file to calculate BMI.   Gynecologic History No LMP recorded. (Menstrual status: Irregular Periods). Contraception: {method:5051} Last Pap: ***. Results were: {norm/abn:16337} Last mammogram: ***. Results were: {norm/abn:16337}  Obstetric History OB History  No obstetric history on file.     {Common ambulatory SmartLinks:19316}  Review of Systems {ros; complete:30496}    Objective:   There were no vitals taken for this visit. VS reviewed, nursing note reviewed,  Constitutional: well developed, well nourished, no distress HEENT: normocephalic CV: normal rate Pulm/chest wall: normal effort Breast Exam:  ***Deferred with low risks and shared decision making, discussed recommendation to start mammogram between 40-50 yo/ exam performed: right breast normal without mass, skin or nipple changes or axillary nodes, left breast normal without mass, skin or nipple changes or axillary nodes Abdomen: soft Neuro: alert and oriented x 3 Skin: warm, dry Psych: affect normal Pelvic exam: ***Deferred/ Performed: Cervix pink, visually closed, without lesion, scant white creamy discharge, vaginal walls and external genitalia normal Bimanual exam: Cervix 0/long/high, firm, anterior, neg CMT, uterus nontender, nonenlarged, adnexa without tenderness, enlargement, or mass       Assessment/Plan:   1. Well woman exam with routine gynecological exam ***       Follow up in: {1-10:13787:::0} {time; units:19136:::0} or as needed.   Sharen Counter, CNM 8:18 AM

## 2020-11-12 ENCOUNTER — Ambulatory Visit: Payer: Medicaid Other | Admitting: Advanced Practice Midwife

## 2020-11-19 ENCOUNTER — Ambulatory Visit: Payer: Self-pay

## 2020-11-19 NOTE — Telephone Encounter (Signed)
Patient called and says her left eye is blurry. She says she is recovering with COVID symptoms, not tested by her daughter tested positive, so she's quarantining the whole house. She says about 1 hour ago while watching television, she noticed the blurred vision and the slight headache she's been having. She took Tylenol and is resting. Denies pain to the eye or loss of vision. I advised to monitor symptoms and if worsen or loss of vision to go to the ED. Otherwise call PCP office in the morning to let them know her symptoms. Patient verbalized understanding.  Reason for Disposition . [1] Brief (now gone) blurred vision AND [2] unexplained  Answer Assessment - Initial Assessment Questions 1. DESCRIPTION: "What is the vision loss like? Describe it for me." (e.g., complete vision loss, blurred vision, double vision, floaters, etc.)     Blurred 2. LOCATION: "One or both eyes?" If one, ask: "Which eye?"     Left eye 3. SEVERITY: "Can you see anything?" If Yes, ask: "What can you see?" (e.g., fine print)     Yes 4. ONSET: "When did this begin?" "Did it start suddenly or has this been gradual?"     1 hour ago it started 5. PATTERN: "Does this come and go, or has it been constant since it started?"     Constant 6. PAIN: "Is there any pain in your eye(s)?"  (Scale 1-10; or mild, moderate, severe)     No 7. CONTACTS-GLASSES: "Do you wear contacts or glasses?"     No 8. CAUSE: "What do you think is causing this visual problem?"     I don't know 9. OTHER SYMPTOMS: "Do you have any other symptoms?" (e.g., confusion, headache, arm or leg weakness, speech problems)     Slight headache 10. PREGNANCY: "Is there any chance you are pregnant?" "When was your last menstrual period?"       No  Protocols used: VISION LOSS OR CHANGE-A-AH

## 2021-01-08 ENCOUNTER — Other Ambulatory Visit (HOSPITAL_COMMUNITY)
Admission: RE | Admit: 2021-01-08 | Discharge: 2021-01-08 | Disposition: A | Discharge status: Home / Self Care | Payer: Medicaid Other | Source: Ambulatory Visit | Attending: Advanced Practice Midwife | Admitting: Advanced Practice Midwife

## 2021-01-08 ENCOUNTER — Ambulatory Visit (INDEPENDENT_AMBULATORY_CARE_PROVIDER_SITE_OTHER): Payer: Medicaid Other | Admitting: Advanced Practice Midwife

## 2021-01-08 ENCOUNTER — Encounter: Payer: Self-pay | Admitting: Advanced Practice Midwife

## 2021-01-08 ENCOUNTER — Other Ambulatory Visit: Payer: Self-pay

## 2021-01-08 VITALS — BP 115/72 | HR 94 | Wt 218.0 lb

## 2021-01-08 DIAGNOSIS — Z0000 Encounter for general adult medical examination without abnormal findings: Secondary | ICD-10-CM

## 2021-01-08 DIAGNOSIS — Z720 Tobacco use: Secondary | ICD-10-CM

## 2021-01-08 DIAGNOSIS — N898 Other specified noninflammatory disorders of vagina: Secondary | ICD-10-CM

## 2021-01-08 DIAGNOSIS — A599 Trichomoniasis, unspecified: Secondary | ICD-10-CM

## 2021-01-08 DIAGNOSIS — Z3009 Encounter for other general counseling and advice on contraception: Secondary | ICD-10-CM | POA: Diagnosis not present

## 2021-01-08 DIAGNOSIS — Z789 Other specified health status: Secondary | ICD-10-CM

## 2021-01-08 DIAGNOSIS — Z202 Contact with and (suspected) exposure to infections with a predominantly sexual mode of transmission: Secondary | ICD-10-CM

## 2021-01-08 NOTE — Progress Notes (Signed)
GYNECOLOGY ANNUAL PREVENTATIVE CARE ENCOUNTER NOTE  History:     Mikayla Cabrera is a 35 y.o.  female here for a routine annual gynecologic exam.  Current complaints: none. Patient reports normal pap at end of December 2021 with a satellite office of her PCP's practice. She states she tested positive for Trichomonas during that evaluation. She endorses treatment as well as timely partner treatment. Denies abnormal vaginal bleeding, discharge, pelvic pain, problems with intercourse or other gynecologic concerns.    Gynecologic History Patient's last menstrual period was 01/01/2021. Contraception: patch Last Pap: Dec 2021. Results were: normal with negative HPV  Obstetric History OB History  No obstetric history on file.    Past Medical History:  Diagnosis Date  . Anxiety   . Depression     Past Surgical History:  Procedure Laterality Date  . TONSILLECTOMY      Current Outpatient Medications on File Prior to Visit  Medication Sig Dispense Refill  . ALPRAZolam (XANAX) 0.25 MG tablet Take 0.25 mg by mouth as needed.    . metoCLOPramide (REGLAN) 5 MG tablet Take 1 tablet (5 mg total) by mouth every 8 (eight) hours as needed for nausea or vomiting. 15 tablet 0  . phentermine (ADIPEX-P) 37.5 MG tablet Take 37.5 mg by mouth every morning.    . Prenatal Vit-Fe Fumarate-FA (PRENATAL COMPLETE) 14-0.4 MG TABS Take 0.4 mg by mouth daily. 30 tablet 0  . Vitamin D, Ergocalciferol, (DRISDOL) 1.25 MG (50000 UNIT) CAPS capsule Take 50,000 Units by mouth once a week.    . [DISCONTINUED] promethazine (PHENERGAN) 12.5 MG tablet Take 1 tablet (12.5 mg total) by mouth every 6 (six) hours as needed for nausea or vomiting. (Patient not taking: Reported on 05/04/2020) 30 tablet 0   No current facility-administered medications on file prior to visit.    No Known Allergies  Social History:  reports that she has been smoking cigarettes. She has never used smokeless tobacco. She reports that she  does not drink alcohol and does not use drugs.  No family history on file.  The following portions of the patient's history were reviewed and updated as appropriate: allergies, current medications, past family history, past medical history, past social history, past surgical history and problem list.  Review of Systems Pertinent items noted in HPI and remainder of comprehensive ROS otherwise negative.  Physical Exam:  BP 115/72   Pulse 94   Wt 218 lb (98.9 kg)   LMP 01/01/2021   BMI 39.87 kg/m  CONSTITUTIONAL: Well-developed, well-nourished female in no acute distress.  HENT:  Normocephalic, atraumatic, External right and left ear normal.  EYES: Conjunctivae and EOM are normal. Pupils are equal, round, and reactive to light. No scleral icterus.  NECK: Normal range of motion, supple, no masses.  Normal thyroid.  SKIN: Skin is warm and dry. No rash noted. Not diaphoretic. No erythema. No pallor. MUSCULOSKELETAL: Normal range of motion. No tenderness.  No cyanosis, clubbing, or edema.   NEUROLOGIC: Alert and oriented to person, place, and time. Normal reflexes, muscle tone coordination.  PSYCHIATRIC: Normal mood and affect. Normal behavior. Normal judgment and thought content. CARDIOVASCULAR: Normal heart rate noted, regular rhythm RESPIRATORY: Clear to auscultation bilaterally. Effort and breath sounds normal, no problems with respiration noted. BREASTS: Symmetric in size. No masses, tenderness, skin changes, nipple drainage, or lymphadenopathy bilaterally. Performed in the presence of a chaperone. ABDOMEN: Soft, no distention noted.  No tenderness, rebound or guarding.  PERINEUM: Two small circular lesions near vaginal  introitus at 7 and 8 o clock. No contact bleeding with swab collection. Normal appearing urethral meatus.  No abnormal discharge noted. Normal uterine size, no other palpable masses, no uterine or adnexal tenderness.  Performed in the presence of a chaperone. Full pelvic  with speculum exam not performed.   Assessment and Plan:    1. Well woman exam (no gynecological exam) - No acute findings beyond two lesions as described above - Patient asked to complete records request - Pelvic deferred  - Cervicovaginal ancillary only( [place])   2. Trichomonas contact, treated - Treated by PCP end of Dec - TOC today  3. Uses hormonal contraceptive patch as primary birth control method - Happy with patch. Declines counseling on other methods  4. Tobacco use   6. Vaginal lesion - Herpes simplex virus culture  Routine preventative health maintenance measures emphasized. Please refer to After Visit Summary for other counseling recommendations.     Total visit time 45 minutes. Greater than 50% of visit spent in counseling and coordination of care  Clayton Bibles, MSN, CNM Certified Nurse Midwife, [facility] for [employer], [facility] 01/08/21 11:25 AM

## 2021-01-08 NOTE — Patient Instructions (Signed)
Preventive Care 21-34 Years Old, Female Preventive care refers to lifestyle choices and visits with your health care provider that can promote health and wellness. This includes:  A yearly physical exam. This is also called an annual wellness visit.  Regular dental and eye exams.  Immunizations.  Screening for certain conditions.  Healthy lifestyle choices, such as: ? Eating a healthy diet. ? Getting regular exercise. ? Not using drugs or products that contain nicotine and tobacco. ? Limiting alcohol use. What can I expect for my preventive care visit? Physical exam Your health care provider may check your:  Height and weight. These may be used to calculate your BMI (body mass index). BMI is a measurement that tells if you are at a healthy weight.  Heart rate and blood pressure.  Body temperature.  Skin for abnormal spots. Counseling Your health care provider may ask you questions about your:  Past medical problems.  Family's medical history.  Alcohol, tobacco, and drug use.  Emotional well-being.  Home life and relationship well-being.  Sexual activity.  Diet, exercise, and sleep habits.  Work and work environment.  Access to firearms.  Method of birth control.  Menstrual cycle.  Pregnancy history. What immunizations do I need? Vaccines are usually given at various ages, according to a schedule. Your health care provider will recommend vaccines for you based on your age, medical history, and lifestyle or other factors, such as travel or where you work.   What tests do I need? Blood tests  Lipid and cholesterol levels. These may be checked every 5 years starting at age 20.  Hepatitis C test.  Hepatitis B test. Screening  Diabetes screening. This is done by checking your blood sugar (glucose) after you have not eaten for a while (fasting).  STD (sexually transmitted disease) testing, if you are at risk.  BRCA-related cancer screening. This may be  done if you have a family history of breast, ovarian, tubal, or peritoneal cancers.  Pelvic exam and Pap test. This may be done every 3 years starting at age 21. Starting at age 30, this may be done every 5 years if you have a Pap test in combination with an HPV test. Talk with your health care provider about your test results, treatment options, and if necessary, the need for more tests.   Follow these instructions at home: Eating and drinking  Eat a healthy diet that includes fresh fruits and vegetables, whole grains, lean protein, and low-fat dairy products.  Take vitamin and mineral supplements as recommended by your health care provider.  Do not drink alcohol if: ? Your health care provider tells you not to drink. ? You are pregnant, may be pregnant, or are planning to become pregnant.  If you drink alcohol: ? Limit how much you have to 0-1 drink a day. ? Be aware of how much alcohol is in your drink. In the [place] one drink equals one 12 oz bottle of beer (355 mL), one 5 oz glass of wine (148 mL), or one 1 oz glass of hard liquor (44 mL).   Lifestyle  Take daily care of your teeth and gums. Brush your teeth every morning and night with fluoride toothpaste. Floss one time each day.  Stay active. Exercise for at least 30 minutes 5 or more days each week.  Do not use any products that contain nicotine or tobacco, such as cigarettes, e-cigarettes, and chewing tobacco. If you need help quitting, ask your health care provider.  Do not   use drugs.  If you are sexually active, practice safe sex. Use a condom or other form of protection to prevent STIs (sexually transmitted infections).  If you do not wish to become pregnant, use a form of birth control. If you plan to become pregnant, see your health care provider for a prepregnancy visit.  Find healthy ways to cope with stress, such as: ? Meditation, yoga, or listening to music. ? Journaling. ? Talking to a trusted  person. ? Spending time with friends and family. Safety  Always wear your seat belt while driving or riding in a vehicle.  Do not drive: ? If you have been drinking alcohol. Do not ride with someone who has been drinking. ? When you are tired or distracted. ? While texting.  Wear a helmet and other protective equipment during sports activities.  If you have firearms in your house, make sure you follow all gun safety procedures.  Seek help if you have been physically or sexually abused. What's next?  Go to your health care provider once a year for an annual wellness visit.  Ask your health care provider how often you should have your eyes and teeth checked.  Stay up to date on all vaccines. This information is not intended to replace advice given to you by your health care provider. Make sure you discuss any questions you have with your health care provider. Document Revised: 07/22/2020 Document Reviewed: 08/05/2018 Elsevier Patient Education  2021 Elsevier Inc.  

## 2021-01-08 NOTE — Progress Notes (Signed)
phsBump on vagina x 1 year

## 2021-01-09 LAB — CERVICOVAGINAL ANCILLARY ONLY
Bacterial Vaginitis (gardnerella): POSITIVE — AB
Candida Glabrata: NEGATIVE
Candida Vaginitis: NEGATIVE
Chlamydia: NEGATIVE
Comment: NEGATIVE
Comment: NEGATIVE
Comment: NEGATIVE
Comment: NEGATIVE
Comment: NEGATIVE
Comment: NORMAL
Neisseria Gonorrhea: NEGATIVE
Trichomonas: NEGATIVE

## 2021-01-10 ENCOUNTER — Other Ambulatory Visit (INDEPENDENT_AMBULATORY_CARE_PROVIDER_SITE_OTHER): Payer: Medicaid Other | Admitting: Advanced Practice Midwife

## 2021-01-10 DIAGNOSIS — B9689 Other specified bacterial agents as the cause of diseases classified elsewhere: Secondary | ICD-10-CM

## 2021-01-10 DIAGNOSIS — N760 Acute vaginitis: Secondary | ICD-10-CM

## 2021-01-10 MED ORDER — METRONIDAZOLE 500 MG PO TABS: 500.0000 mg | ORAL_TABLET | Freq: Two times a day (BID) | ORAL | 0 refills | Status: AC

## 2021-01-10 NOTE — Progress Notes (Signed)
+   BV from swab collected 01/08/2021. Notified via active MyChart.   Clayton Bibles, MSN, CNM Certified Nurse Midwife, [employer] for [employer], [facility] 01/10/21 8:20 AM

## 2021-01-11 LAB — HERPES SIMPLEX VIRUS CULTURE

## 2021-01-27 ENCOUNTER — Encounter (HOSPITAL_COMMUNITY): Payer: Self-pay | Admitting: Emergency Medicine

## 2021-01-27 ENCOUNTER — Other Ambulatory Visit: Payer: Self-pay

## 2021-01-27 ENCOUNTER — Emergency Department (HOSPITAL_COMMUNITY)
Admission: EM | Admit: 2021-01-27 | Discharge: 2021-01-27 | Disposition: A | Discharge status: Home / Self Care | Payer: Medicaid Other | Attending: Emergency Medicine | Admitting: Emergency Medicine

## 2021-01-27 ENCOUNTER — Emergency Department (HOSPITAL_COMMUNITY): Payer: Medicaid Other

## 2021-01-27 DIAGNOSIS — F17210 Nicotine dependence, cigarettes, uncomplicated: Secondary | ICD-10-CM | POA: Insufficient documentation

## 2021-01-27 DIAGNOSIS — W231XXA Caught, crushed, jammed, or pinched between stationary objects, initial encounter: Secondary | ICD-10-CM | POA: Diagnosis not present

## 2021-01-27 DIAGNOSIS — M20012 Mallet finger of left finger(s): Secondary | ICD-10-CM

## 2021-01-27 DIAGNOSIS — S62657A Nondisplaced fracture of medial phalanx of left little finger, initial encounter for closed fracture: Secondary | ICD-10-CM | POA: Diagnosis not present

## 2021-01-27 DIAGNOSIS — Y9389 Activity, other specified: Secondary | ICD-10-CM | POA: Insufficient documentation

## 2021-01-27 DIAGNOSIS — S6992XA Unspecified injury of left wrist, hand and finger(s), initial encounter: Secondary | ICD-10-CM | POA: Diagnosis present

## 2021-01-27 NOTE — ED Notes (Signed)
Finger splint placed per pa instructions.

## 2021-01-27 NOTE — ED Triage Notes (Signed)
Pt reports left pinky pain. Pt states she "jammed it trying to save her dog from a fight with another dog."

## 2021-01-27 NOTE — Discharge Instructions (Addendum)
The x-ray of your finger today shows that you have a broken bone in your little finger.  It is important that you keep your finger splinted until you see the orthopedic provider.  Call his office tomorrow to arrange a follow-up appointment.  Tylenol or ibuprofen if needed for pain.

## 2021-01-27 NOTE — ED Notes (Signed)
Pt has some swelling to her L Pinky finger. PA at bedside, resps even and unlabored

## 2021-01-27 NOTE — ED Provider Notes (Signed)
[facility] EMERGENCY DEPARTMENT Provider Note   CSN: [id number] Arrival date & time: 01/27/21  1281     History Chief Complaint  Patient presents with  . Finger Injury    Mikayla Cabrera is a 35 y.o. female.  HPI      Mikayla Cabrera is a 35 y.o. female who presents to the Emergency Department complaining of injury to her left fifth finger that occurred 2 weeks ago.  She states that she was trying to separate a fight between her dog and another dog and "jammed" her pinky finger on a brick wall.  She reports noticing an immediate deformity of her finger.  She states she pulled out wart on her finger and applied a splint to her finger for 1 week.  She continues to have swelling and a mallet deformity to the tip of the finger.  She reports improving swelling of her finger and minimal tenderness at the middle phalanx.  He is unable to fully extend the distal joint of the finger.  She denies numbness, wrist pain, redness or open wound.  No injury of the nail.  She is right-hand dominant.    Past Medical History:  Diagnosis Date  . Anxiety   . Depression     Patient Active Problem List   Diagnosis Date Noted  . Trichomonas infection 01/08/2021    Past Surgical History:  Procedure Laterality Date  . TONSILLECTOMY       OB History   No obstetric history on file.     No family history on file.  Social History   Tobacco Use  . Smoking status: Current Some Day Smoker    Types: Cigarettes  . Smokeless tobacco: Never Used  Substance Use Topics  . Alcohol use: No  . Drug use: No    Home Medications Prior to Admission medications   Medication Sig Start Date End Date Taking? Authorizing Provider  ALPRAZolam (XANAX) 0.25 MG tablet Take 0.25 mg by mouth as needed. 12/28/20   [provider]  metoCLOPramide (REGLAN) 5 MG tablet Take 1 tablet (5 mg total) by mouth every 8 (eight) hours as needed for nausea or vomiting. 05/04/20   McDonald, Mia A, PA-C   metroNIDAZOLE (FLAGYL) 500 MG tablet Take 1 tablet (500 mg total) by mouth 2 (two) times daily. 01/10/21   Calvert Cantor, CNM  phentermine (ADIPEX-P) 37.5 MG tablet Take 37.5 mg by mouth every morning. 10/02/20   [provider]  Prenatal Vit-Fe Fumarate-FA (PRENATAL COMPLETE) 14-0.4 MG TABS Take 0.4 mg by mouth daily. 05/04/20   McDonald, Mia A, PA-C  Vitamin D, Ergocalciferol, (DRISDOL) 1.25 MG (50000 UNIT) CAPS capsule Take 50,000 Units by mouth once a week. 10/05/20   [provider]  promethazine (PHENERGAN) 12.5 MG tablet Take 1 tablet (12.5 mg total) by mouth every 6 (six) hours as needed for nausea or vomiting. Patient not taking: Reported on 05/04/2020 12/18/16 05/04/20  Arnaldo Natal, MD    Allergies    Patient has no known allergies.  Review of Systems   Review of Systems  Constitutional: Negative for chills and fever.  Genitourinary: Negative for difficulty urinating and dysuria.  Musculoskeletal: Positive for arthralgias (Swelling and deformity of the left fifth finger) and joint swelling.  Skin: Negative for color change and wound.  Neurological: Negative for weakness and numbness.    Physical Exam Updated Vital Signs BP 113/74 (BP Location: Right Arm)   Pulse (!) 55   Temp 98.5 F (36.9  C) (Oral)   Resp 15   LMP 01/01/2021   SpO2 99%   Physical Exam Vitals and nursing note reviewed.  Constitutional:      Appearance: Normal appearance.  HENT:     Head: Atraumatic.  Cardiovascular:     Rate and Rhythm: Normal rate and regular rhythm.     Pulses: Normal pulses.  Pulmonary:     Effort: Pulmonary effort is normal.     Breath sounds: Normal breath sounds.  Musculoskeletal:        General: Swelling, deformity and signs of injury present.     Comments: Mallet deformity of the distal left fifth finger.  Edema at the PIP joint.  MCP joint nontender.  Wrist nontender.  No open wound or nail injury.  Distal sensation intact.  Skin:    General:  Skin is warm.     Capillary Refill: Capillary refill takes less than 2 seconds.     Findings: No rash.  Neurological:     General: No focal deficit present.     Mental Status: She is alert.     Sensory: No sensory deficit.     Motor: No weakness.     ED Results / Procedures / Treatments   Labs (all labs ordered are listed, but only abnormal results are displayed) Labs Reviewed - No data to display  EKG None  Radiology DG Hand Complete Left  Result Date: 01/27/2021 CLINICAL DATA:  Pain and left little finger. Injured finger trying to save her dog from finding with another dog the EXAM: LEFT HAND - COMPLETE 3+ VIEW COMPARISON:  None. FINDINGS: Age-indeterminate obliquely oriented fracture is identified involving the fifth middle phalanx. The fracture fragments are in near anatomic alignment. No dislocation identified. Mild soft tissue swelling. IMPRESSION: Age-indeterminate, obliquely oriented fracture involving the fifth middle phalanx. Electronically Signed   By: Signa Kell M.D.   On: 01/27/2021 10:57    Procedures Procedures   Medications Ordered in ED Medications - No data to display  ED Course  I have reviewed the triage vital signs and the nursing notes.  Pertinent labs & imaging results that were available during my care of the patient were reviewed by me and considered in my medical decision making (see chart for details).    MDM Rules/Calculators/A&P                          Patient here with injury of the left fifth finger that occurred 2 weeks ago.  No open wound or nail injury.  Sensation intact.  X-ray shows a fracture involving the middle phalanx.  Mallet deformity noted.    X-ray findings discussed with patient along with need for orthopedic follow-up.  Finger will be splinted in extension.  Patient agrees to keep finger splinted until seen by orthopedics.   Final Clinical Impression(s) / ED Diagnoses Final diagnoses:  Mallet finger of left hand   Nondisplaced fracture of middle phalanx of left little finger, initial encounter for closed fracture    Rx / DC Orders ED Discharge Orders    None       Pauline Aus, PA-C 01/27/21 1127    Rozelle Logan, DO 01/28/21 [id number]

## 2021-10-09 IMAGING — DX DG HAND COMPLETE 3+V*L*
3 series · 3 of 3 positions shown · non-contrast
Comparison: None.

CLINICAL DATA: Pain and left little finger. Injured finger trying
to save her dog from finding with another dog the

EXAM:
LEFT HAND - COMPLETE 3+ VIEW

[hand pa]
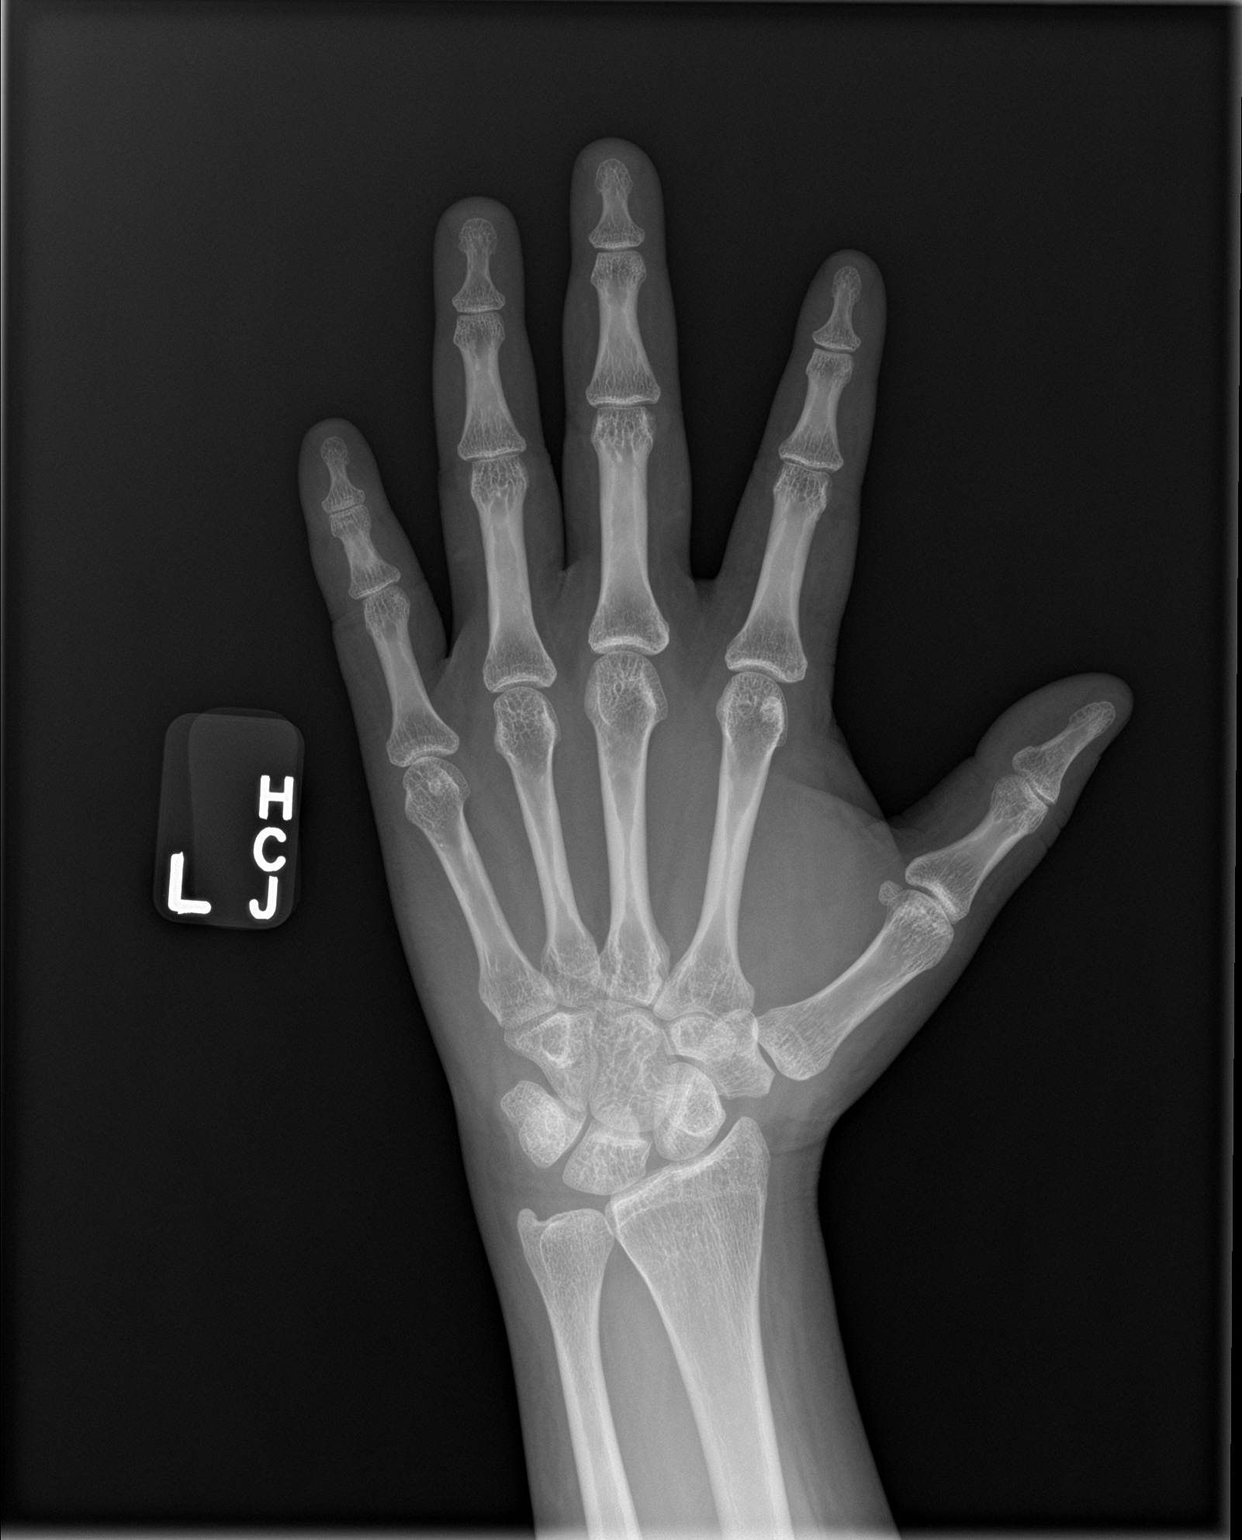

[hand obl]
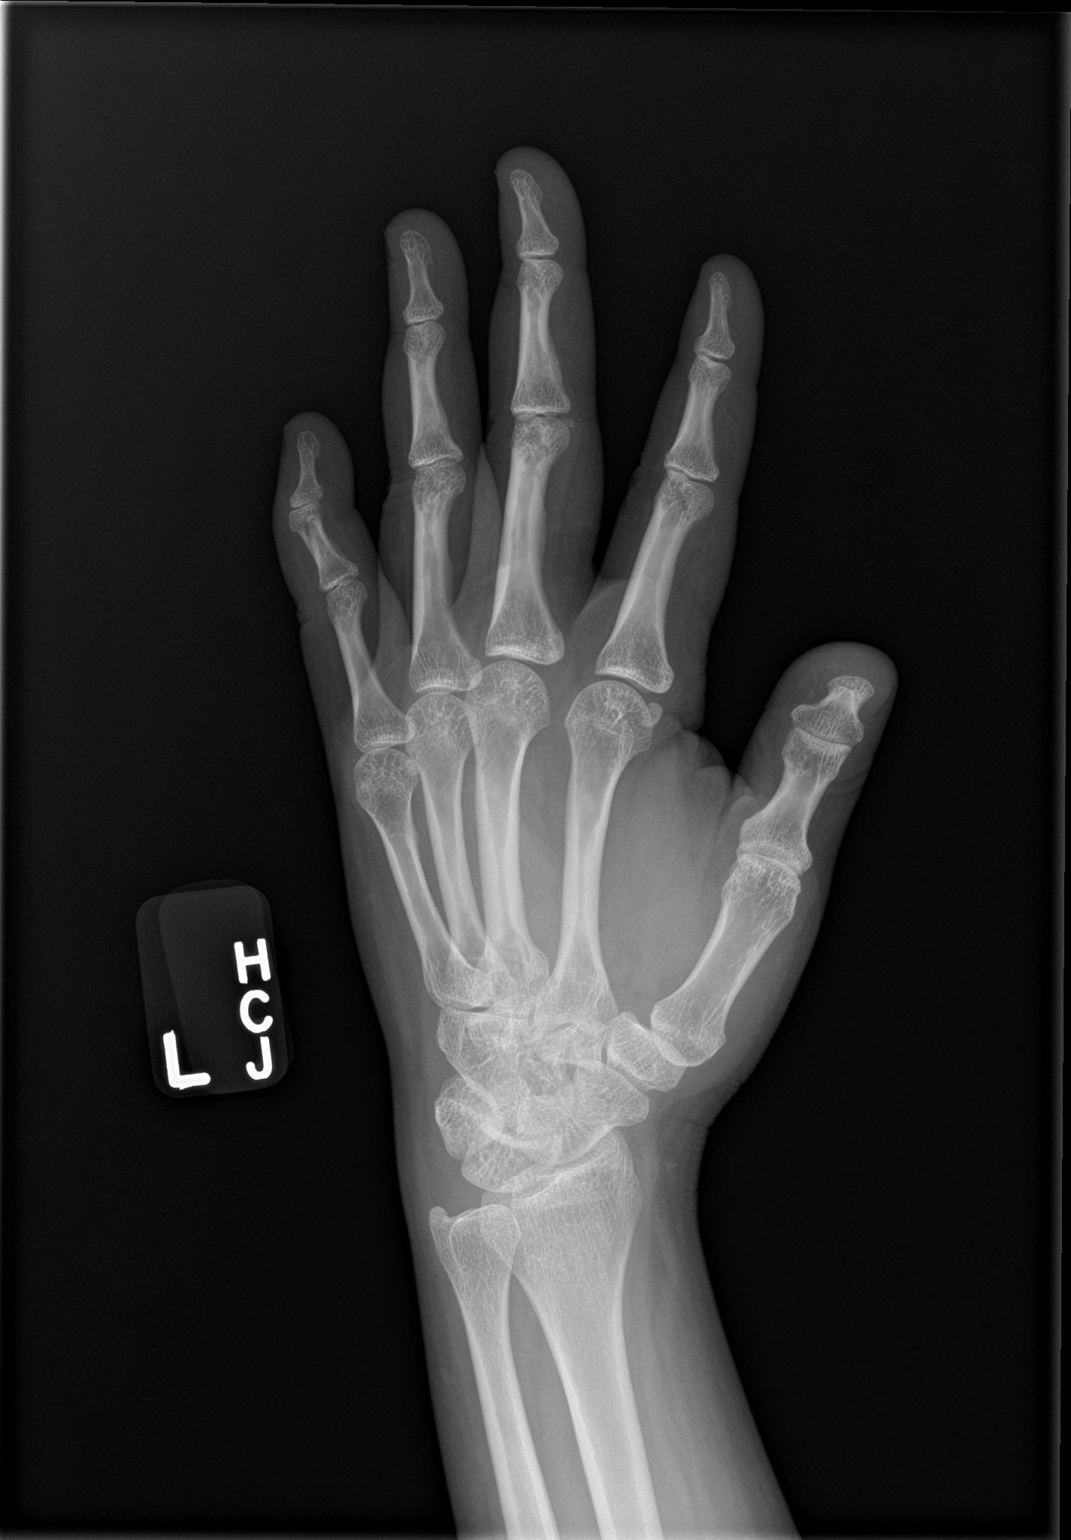

[hand lat]
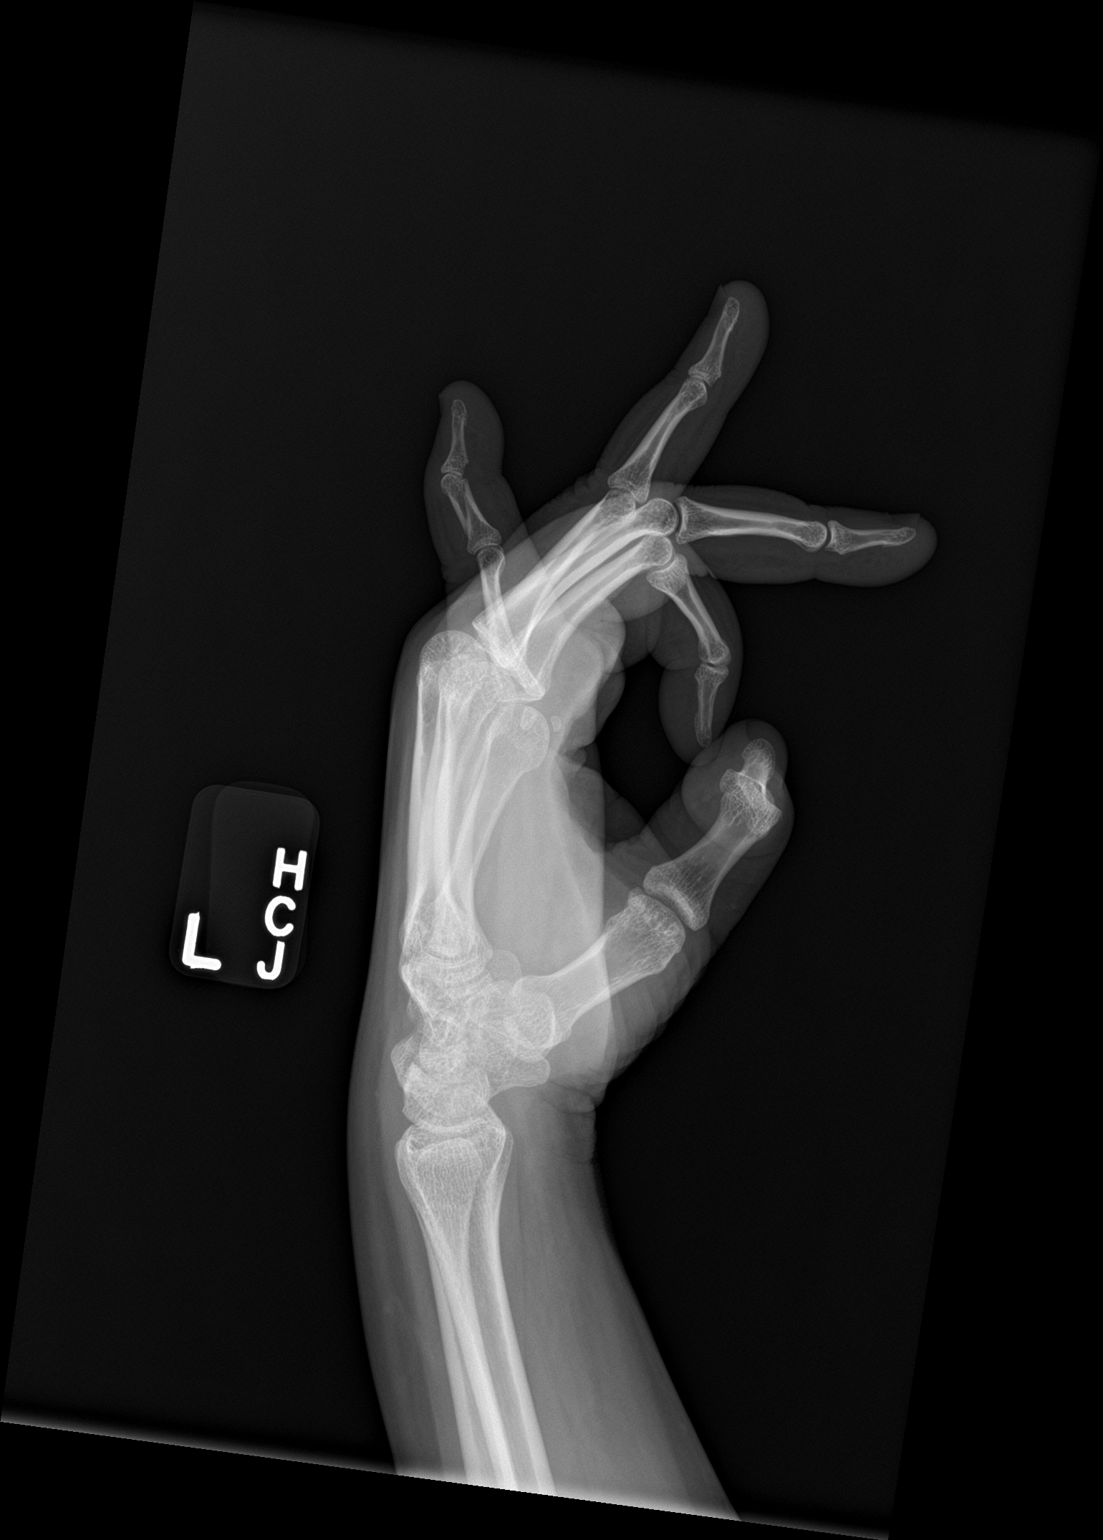

[3 of 3 positions shown; findings below may reference images not displayed]

FINDINGS: Age-indeterminate obliquely oriented fracture is identified
involving the fifth middle phalanx. The fracture fragments are in
near anatomic alignment. No dislocation identified. Mild soft tissue
swelling.
IMPRESSION: Age-indeterminate, obliquely oriented fracture involving the fifth
middle phalanx.

## 2022-05-07 ENCOUNTER — Ambulatory Visit: Payer: Medicaid Other

## 2022-12-11 ENCOUNTER — Encounter: Payer: Self-pay | Admitting: Obstetrics

## 2022-12-11 ENCOUNTER — Ambulatory Visit (INDEPENDENT_AMBULATORY_CARE_PROVIDER_SITE_OTHER): Payer: Medicaid Other

## 2022-12-11 DIAGNOSIS — Z3201 Encounter for pregnancy test, result positive: Secondary | ICD-10-CM | POA: Diagnosis not present

## 2022-12-11 DIAGNOSIS — Z3200 Encounter for pregnancy test, result unknown: Secondary | ICD-10-CM

## 2022-12-11 LAB — POCT URINE PREGNANCY: Preg Test, Ur: POSITIVE — AB

## 2022-12-11 NOTE — Progress Notes (Deleted)
..  Mikayla Cabrera presents today for UPT. She has no unusual complaints. LMP: 09/21/22    OBJECTIVE: Appears well, in no apparent distress.  OB History     Gravida  1   Para      Term      Preterm      AB      Living         SAB      IAB      Ectopic      Multiple      Live Births             Home UPT Result: Positive In-Office UPT result: Positive I have reviewed the patient's medical, obstetrical, social, and family histories, and medications.   ASSESSMENT: Positive pregnancy test  PLAN Prenatal care to be completed at: [facility]

## 2022-12-11 NOTE — Progress Notes (Signed)
..  Mikayla Cabrera presents today for UPT. She has no unusual complaints. LMP: 09/21/22    OBJECTIVE: Appears well, in no apparent distress.  OB History     Gravida  7   Para  5   Term  4   Preterm  1   AB  1   Living  4      SAB      IAB  1   Ectopic      Multiple      Live Births             Home UPT Result: Not performed In-Office UPT result: Positive I have reviewed the patient's medical, obstetrical, social, and family histories, and medications.   ASSESSMENT: Positive pregnancy test  PLAN Prenatal care to be completed at: [facility]

## 2022-12-24 ENCOUNTER — Ambulatory Visit (INDEPENDENT_AMBULATORY_CARE_PROVIDER_SITE_OTHER): Payer: Medicaid Other

## 2022-12-24 ENCOUNTER — Other Ambulatory Visit (HOSPITAL_COMMUNITY)
Admission: RE | Admit: 2022-12-24 | Discharge: 2022-12-24 | Disposition: A | Discharge status: Home / Self Care | Payer: Medicaid Other | Source: Ambulatory Visit | Attending: Obstetrics & Gynecology | Admitting: Obstetrics & Gynecology

## 2022-12-24 VITALS — BP 122/78 | HR 84 | Ht 61.5 in | Wt 193.4 lb

## 2022-12-24 DIAGNOSIS — O4691 Antepartum hemorrhage, unspecified, first trimester: Secondary | ICD-10-CM | POA: Diagnosis not present

## 2022-12-24 DIAGNOSIS — O219 Vomiting of pregnancy, unspecified: Secondary | ICD-10-CM

## 2022-12-24 DIAGNOSIS — Z3A01 Less than 8 weeks gestation of pregnancy: Secondary | ICD-10-CM

## 2022-12-24 DIAGNOSIS — Z3480 Encounter for supervision of other normal pregnancy, unspecified trimester: Secondary | ICD-10-CM | POA: Diagnosis not present

## 2022-12-24 DIAGNOSIS — O3680X0 Pregnancy with inconclusive fetal viability, not applicable or unspecified: Secondary | ICD-10-CM

## 2022-12-24 DIAGNOSIS — O4690 Antepartum hemorrhage, unspecified, unspecified trimester: Secondary | ICD-10-CM

## 2022-12-24 MED ORDER — GOJJI WEIGHT SCALE MISC: 1.0000 | 0 refills | Status: AC

## 2022-12-24 MED ORDER — BLOOD PRESSURE KIT DEVI: 1.0000 | 0 refills | Status: AC

## 2022-12-24 MED ORDER — VITAFOL ULTRA 29-0.6-0.4-200 MG PO CAPS: 1.0000 | ORAL_CAPSULE | Freq: Every day | ORAL | 11 refills | Status: AC

## 2022-12-24 MED ORDER — PROMETHAZINE HCL 25 MG PO TABS: 25.0000 mg | ORAL_TABLET | Freq: Four times a day (QID) | ORAL | 2 refills | Status: AC | PRN

## 2022-12-24 NOTE — Progress Notes (Signed)
New OB Intake  I connected with Mikayla Cabrera  on 12/24/22 at 10:15 AM EST by in person and verified that I am speaking with the correct person using two identifiers. Nurse is located at [facility] and pt is located at [place].  I discussed the limitations, risks, security and privacy concerns of performing an evaluation and management service by telephone and the availability of in person appointments. I also discussed with the patient that there may be a patient responsible charge related to this service. The patient expressed understanding and agreed to proceed.  I explained I am completing New OB Intake today. We discussed EDD of 08/08/23 that is based on first trimester u/s at [redacted]w[redacted]d. Pt is G8/P4214. I reviewed her allergies, medications, Medical/Surgical/OB history, and appropriate screenings. I informed her of [facility] services. [facility] information placed in AVS. Based on history, this is a high risk pregnancy.  Patient Active Problem List   Diagnosis Date Noted   Trichomonas infection 01/08/2021    Concerns addressed today  Delivery Plans Plans to deliver at [facility]. Patient given information for [facility] Healthy Baby website for more information about [facility] and [place]. Patient is not interested in water birth. Offered upcoming OB visit with CNM to discuss further.  MyChart/Babyscripts MyChart access verified. I explained pt will have some visits in office and some virtually. Babyscripts instructions given and order placed. Patient verifies receipt of registration text/e-mail. Account successfully created and app downloaded.  Blood Pressure Cuff/Weight Scale Blood pressure cuff ordered for patient to pick-up from [employer]. Explained after first prenatal appt pt will check weekly and document in 71. Patient does not have weight scale; order sent to [place], patient may track weight weekly in Babyscripts.  Anatomy US Explained first scheduled [place] will be around  19 weeks. Anatomy US TBD.  Labs Discussed Johnsie Cancel genetic screening with patient. Would like both Panorama and Horizon drawn at new OB visit. Routine prenatal labs needed.  COVID Vaccine Patient has not had COVID vaccine.   Is patient a CenteringPregnancy candidate?  Declined Declined due to Group setting Not a candidate due to  If accepted,   Social Determinants of Health Food Insecurity: Patient denies food insecurity. WIC Referral: Patient is interested in referral to [facility].  Transportation: Patient denies transportation needs. Childcare: Discussed no children allowed at ultrasound appointments. Offered childcare services; patient declines childcare services at this time.  First visit review I reviewed new OB appt with patient. I explained they will have a provider visit that includes pap smear, genetic screening, and discuss plan of care for pregnancy. Explained pt will be seen by Baltazar Najjar, MD  at first visit; encounter routed to appropriate provider. Explained that patient will be seen by pregnancy navigator following visit with provider.   Mikayla Lei, RN 12/24/2022  9:33 AM

## 2022-12-24 NOTE — Progress Notes (Signed)
Patient states that she started bleeding afternoon. She states that bleeding started off as increased vaginal discharge, light brown, to pink, and now dark brown again. She states that she did have enough to fill 1 pad yesterday. Bleeding then decreased around 10 pm last night. She also complains of having some lower abdominal cramping on left side. Rates pain as a 6/10 at its worst, but at this time 4/10. Last intercourse a week ago. Patient is concerned for STD. Also not sure of dates due to intermittent bleeding. Patient advised to monitor bleeding and if bleeding and pain continues to be re-evaluated at [facility]. Patient verbalized understanding. Viable u/s today with FHR 160.

## 2022-12-25 ENCOUNTER — Encounter: Payer: Self-pay | Admitting: Obstetrics

## 2022-12-25 LAB — CBC/D/PLT+RPR+RH+ABO+RUBIGG...
Antibody Screen: NEGATIVE
Basophils Absolute: 0 10*3/uL (ref 0.0–0.2)
Basos: 0 %
EOS (ABSOLUTE): 0.1 10*3/uL (ref 0.0–0.4)
Eos: 1 %
HCV Ab: NONREACTIVE
HIV Screen 4th Generation wRfx: NONREACTIVE
Hematocrit: 39 % (ref 34.0–46.6)
Hemoglobin: 13.1 g/dL (ref 11.1–15.9)
Hepatitis B Surface Ag: NEGATIVE
Immature Grans (Abs): 0 10*3/uL (ref 0.0–0.1)
Immature Granulocytes: 0 %
Lymphocytes Absolute: 1.6 10*3/uL (ref 0.7–3.1)
Lymphs: 24 %
MCH: 29.4 pg (ref 26.6–33.0)
MCHC: 33.6 g/dL (ref 31.5–35.7)
MCV: 87 fL (ref 79–97)
Monocytes Absolute: 0.7 10*3/uL (ref 0.1–0.9)
Monocytes: 10 %
Neutrophils Absolute: 4.4 10*3/uL (ref 1.4–7.0)
Neutrophils: 65 %
Platelets: 366 10*3/uL (ref 150–450)
RBC: 4.46 x10E6/uL (ref 3.77–5.28)
RDW: 11.7 % (ref 11.7–15.4)
RPR Ser Ql: NONREACTIVE
Rh Factor: POSITIVE
Rubella Antibodies, IGG: 3.73 index (ref >0.99)
WBC: 6.9 10*3/uL (ref 3.4–10.8)

## 2022-12-25 LAB — CERVICOVAGINAL ANCILLARY ONLY
Chlamydia: NEGATIVE
Comment: NEGATIVE
Comment: NEGATIVE
Comment: NORMAL
Neisseria Gonorrhea: NEGATIVE
Trichomonas: NEGATIVE

## 2022-12-25 LAB — HCV INTERPRETATION

## 2022-12-26 LAB — URINE CULTURE, OB REFLEX: Organism ID, Bacteria: NO GROWTH

## 2022-12-26 LAB — CULTURE, OB URINE

## 2023-01-07 ENCOUNTER — Emergency Department (HOSPITAL_BASED_OUTPATIENT_CLINIC_OR_DEPARTMENT_OTHER): Payer: Medicaid Other

## 2023-01-07 ENCOUNTER — Observation Stay (HOSPITAL_BASED_OUTPATIENT_CLINIC_OR_DEPARTMENT_OTHER)
Admission: EM | Admit: 2023-01-07 | Discharge: 2023-01-08 | Disposition: A | Discharge status: Home / Self Care | Payer: Medicaid Other | Attending: Emergency Medicine | Admitting: Emergency Medicine

## 2023-01-07 ENCOUNTER — Other Ambulatory Visit (HOSPITAL_BASED_OUTPATIENT_CLINIC_OR_DEPARTMENT_OTHER): Payer: Self-pay

## 2023-01-07 ENCOUNTER — Other Ambulatory Visit: Payer: Self-pay

## 2023-01-07 ENCOUNTER — Encounter (HOSPITAL_BASED_OUTPATIENT_CLINIC_OR_DEPARTMENT_OTHER): Payer: Self-pay

## 2023-01-07 DIAGNOSIS — K831 Obstruction of bile duct: Principal | ICD-10-CM | POA: Insufficient documentation

## 2023-01-07 DIAGNOSIS — Z79899 Other long term (current) drug therapy: Secondary | ICD-10-CM | POA: Insufficient documentation

## 2023-01-07 DIAGNOSIS — E876 Hypokalemia: Secondary | ICD-10-CM

## 2023-01-07 DIAGNOSIS — O99281 Endocrine, nutritional and metabolic diseases complicating pregnancy, first trimester: Secondary | ICD-10-CM | POA: Diagnosis not present

## 2023-01-07 DIAGNOSIS — O26611 Liver and biliary tract disorders in pregnancy, first trimester: Principal | ICD-10-CM

## 2023-01-07 DIAGNOSIS — O418X10 Other specified disorders of amniotic fluid and membranes, first trimester, not applicable or unspecified: Secondary | ICD-10-CM | POA: Diagnosis not present

## 2023-01-07 DIAGNOSIS — Z87891 Personal history of nicotine dependence: Secondary | ICD-10-CM | POA: Diagnosis not present

## 2023-01-07 DIAGNOSIS — O219 Vomiting of pregnancy, unspecified: Secondary | ICD-10-CM | POA: Diagnosis present

## 2023-01-07 DIAGNOSIS — O99011 Anemia complicating pregnancy, first trimester: Principal | ICD-10-CM | POA: Insufficient documentation

## 2023-01-07 DIAGNOSIS — O26641 Intrahepatic cholestasis of pregnancy, first trimester: Secondary | ICD-10-CM | POA: Diagnosis not present

## 2023-01-07 DIAGNOSIS — O211 Hyperemesis gravidarum with metabolic disturbance: Secondary | ICD-10-CM | POA: Diagnosis present

## 2023-01-07 DIAGNOSIS — O210 Mild hyperemesis gravidarum: Principal | ICD-10-CM | POA: Diagnosis present

## 2023-01-07 DIAGNOSIS — Z3A09 9 weeks gestation of pregnancy: Secondary | ICD-10-CM

## 2023-01-07 DIAGNOSIS — D612 Aplastic anemia due to other external agents: Secondary | ICD-10-CM | POA: Diagnosis not present

## 2023-01-07 LAB — CBC WITH DIFFERENTIAL/PLATELET
Abs Immature Granulocytes: 0.02 10*3/uL (ref 0.00–0.07)
Basophils Absolute: 0 10*3/uL (ref 0.0–0.1)
Basophils Relative: 0 %
Eosinophils Absolute: 0.1 10*3/uL (ref 0.0–0.5)
Eosinophils Relative: 1 %
HCT: 39.4 % (ref 36.0–46.0)
Hemoglobin: 13.8 g/dL (ref 12.0–15.0)
Immature Granulocytes: 0 %
Lymphocytes Relative: 23 %
Lymphs Abs: 1.5 10*3/uL (ref 0.7–4.0)
MCH: 29.8 pg (ref 26.0–34.0)
MCHC: 35 g/dL (ref 30.0–36.0)
MCV: 85.1 fL (ref 80.0–100.0)
Monocytes Absolute: 0.8 10*3/uL (ref 0.1–1.0)
Monocytes Relative: 13 %
Neutro Abs: 4 10*3/uL (ref 1.7–7.7)
Neutrophils Relative %: 63 %
Platelets: 358 10*3/uL (ref 150–400)
RBC: 4.63 MIL/uL (ref 3.87–5.11)
RDW: 11.7 % (ref 11.5–15.5)
WBC: 6.4 10*3/uL (ref 4.0–10.5)
nRBC: 0 % (ref 0.0–0.2)

## 2023-01-07 LAB — COMPREHENSIVE METABOLIC PANEL
ALT: 234 U/L — ABNORMAL HIGH (ref 0–44)
AST: 96 U/L — ABNORMAL HIGH (ref 15–41)
Albumin: 4.4 g/dL (ref 3.5–5.0)
Alkaline Phosphatase: 86 U/L (ref 38–126)
Anion gap: 17 — ABNORMAL HIGH (ref 5–15)
BUN: 12 mg/dL (ref 6–20)
CO2: 23 mmol/L (ref 22–32)
Calcium: 10.2 mg/dL (ref 8.9–10.3)
Chloride: 91 mmol/L — ABNORMAL LOW (ref 98–111)
Creatinine, Ser: 0.79 mg/dL (ref 0.44–1.00)
GFR, Estimated: >60 mL/min (ref >60)
Glucose, Bld: 103 mg/dL — ABNORMAL HIGH (ref 70–99)
Potassium: 2.8 mmol/L — ABNORMAL LOW (ref 3.5–5.1)
Sodium: 131 mmol/L — ABNORMAL LOW (ref 135–145)
Total Bilirubin: 1.3 mg/dL — ABNORMAL HIGH (ref 0.3–1.2)
Total Protein: 7.6 g/dL (ref 6.5–8.1)

## 2023-01-07 LAB — PROTIME-INR
INR: 1.3 — ABNORMAL HIGH (ref 0.8–1.2)
Prothrombin Time: 16.2 seconds — ABNORMAL HIGH (ref 11.4–15.2)

## 2023-01-07 LAB — LIPASE, BLOOD: Lipase: 25 U/L (ref 11–51)

## 2023-01-07 LAB — MAGNESIUM: Magnesium: 1.7 mg/dL (ref 1.7–2.4)

## 2023-01-07 MED ORDER — SODIUM CHLORIDE 0.9 % IV BOLUS
1000.0000 mL | Freq: Once | INTRAVENOUS | Status: AC
  Administered 2023-01-07: 1000 mL via INTRAVENOUS

## 2023-01-07 MED ORDER — CALCIUM CARBONATE ANTACID 500 MG PO CHEW
1.0000 | CHEWABLE_TABLET | Freq: Once | ORAL | Status: DC
  Filled 2023-01-07: qty 1

## 2023-01-07 MED ORDER — HYOSCYAMINE SULFATE 0.125 MG SL SUBL
0.1250 mg | SUBLINGUAL_TABLET | Freq: Once | SUBLINGUAL | Status: AC
  Administered 2023-01-07: 0.125 mg via SUBLINGUAL
  Filled 2023-01-07: qty 1

## 2023-01-07 MED ORDER — FAMOTIDINE 20 MG PO TABS
20.0000 mg | ORAL_TABLET | Freq: Once | ORAL | Status: AC
  Administered 2023-01-07: 20 mg via ORAL
  Filled 2023-01-07: qty 1

## 2023-01-07 MED ORDER — POTASSIUM CHLORIDE 10 MEQ/100ML IV SOLN
10.0000 meq | INTRAVENOUS | Status: AC
  Administered 2023-01-07 – 2023-01-08 (×3): 10 meq via INTRAVENOUS
  Filled 2023-01-07 (×2): qty 100

## 2023-01-07 MED ORDER — POTASSIUM CHLORIDE 10 MEQ/100ML IV SOLN
10.0000 meq | INTRAVENOUS | Status: AC
  Filled 2023-01-07: qty 100

## 2023-01-07 MED ORDER — ONDANSETRON HCL 4 MG/2ML IJ SOLN
4.0000 mg | Freq: Once | INTRAMUSCULAR | Status: AC
  Administered 2023-01-07: 4 mg via INTRAVENOUS
  Filled 2023-01-07: qty 2

## 2023-01-07 MED ORDER — POTASSIUM CHLORIDE CRYS ER 20 MEQ PO TBCR
40.0000 meq | EXTENDED_RELEASE_TABLET | Freq: Once | ORAL | Status: DC
  Filled 2023-01-07: qty 2

## 2023-01-07 MED ORDER — ALUM & MAG HYDROXIDE-SIMETH 200-200-20 MG/5ML PO SUSP
15.0000 mL | Freq: Once | ORAL | Status: AC
  Administered 2023-01-07: 15 mL via ORAL
  Filled 2023-01-07: qty 30

## 2023-01-07 NOTE — ED Notes (Signed)
Pt okay'd by Dr. Armandina Gemma to eat.

## 2023-01-07 NOTE — ED Triage Notes (Signed)
She c/o vaginal bleeding "for weeks now". She states she is seen at [facility] for same. She is here for persistent bleeding and "weakness".

## 2023-01-07 NOTE — ED Notes (Signed)
Thomas with [facility] called for transport 

## 2023-01-07 NOTE — ED Provider Notes (Signed)
[facility] Provider Note   CSN: [id number] Arrival date & time: 01/07/23  1048     History  Chief Complaint  Patient presents with   Vaginal Bleeding         Mikayla Cabrera is a 37 y.o. female.  37 year old female presents emergency room with concern for generalized weakness.  Patient is 9 weeks and 4 days pregnant, G9 P4-1-3-4, seen by her OB on December 25, 2022 with IUP confirmed.  Reports that she has been vomiting for weeks, unable to keep anything down.  Has tried Zofran, Phenergan, vitamins without any improvement.  Last vomited this morning prior to walking in the door.  States that her urine is a dark brown color currently, called her OB who advised her to go to the emergency room.  States that she has been having dark blood/brown spotting for weeks as well.  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  Reports using about 1 pad per day.       Home Medications Prior to Admission medications   Medication Sig Start Date End Date Taking? Authorizing Provider  Blood Pressure Monitoring (BLOOD PRESSURE KIT) DEVI 1 kit by Does not apply route once a week. 12/24/22   Woodroe Mode, MD  metoCLOPramide (REGLAN) 5 MG tablet Take 1 tablet (5 mg total) by mouth every 8 (eight) hours as needed for nausea or vomiting. 05/04/20   McDonald, Mia A, PA-C  metroNIDAZOLE (FLAGYL) 500 MG tablet Take 1 tablet (500 mg total) by mouth 2 (two) times daily. 01/10/21   Darlina Rumpf, CNM  Misc. Devices (GOJJI WEIGHT SCALE) MISC 1 Device by Does not apply route every 30 (thirty) days. 12/24/22   Woodroe Mode, MD  phentermine (ADIPEX-P) 37.5 MG tablet Take 37.5 mg by mouth every morning. 10/02/20   [provider]  Prenat-Fe Poly-Methfol-FA-DHA (VITAFOL ULTRA) 29-0.6-0.4-200 MG CAPS Take 1 capsule by mouth daily. 12/24/22   Woodroe Mode, MD  Prenatal Vit-Fe Fumarate-FA (PRENATAL  COMPLETE) 14-0.4 MG TABS Take 0.4 mg by mouth daily. 05/04/20   McDonald, Mia A, PA-C  promethazine (PHENERGAN) 25 MG tablet Take 1 tablet (25 mg total) by mouth every 6 (six) hours as needed for nausea or vomiting. 12/24/22   Woodroe Mode, MD  Vitamin D, Ergocalciferol, (DRISDOL) 1.25 MG (50000 UNIT) CAPS capsule Take 50,000 Units by mouth once a week. 10/05/20   [provider]      Allergies    Patient has no known allergies.    Review of Systems   Review of Systems Negative except as per HPI Physical Exam Updated Vital Signs BP 109/75   Pulse 91   Temp 98.5 F (36.9 C) (Oral)   Resp 16   Ht 5' 1.5" (1.562 m)   Wt 81.2 kg   LMP  (LMP Unknown)   SpO2 100%   Breastfeeding Unknown   BMI 33.27 kg/m  Physical Exam Vitals and nursing note reviewed.  Constitutional:      General: She is not in acute distress.    Appearance: She is well-developed. She is not diaphoretic.  HENT:     Head: Normocephalic and atraumatic.     Mouth/Throat:     Mouth: Mucous membranes are dry.  Eyes:     Conjunctiva/sclera: Conjunctivae normal.  Cardiovascular:     Rate and Rhythm: Normal rate and regular rhythm.     Heart sounds: Normal heart  sounds.  Pulmonary:     Effort: Pulmonary effort is normal.     Breath sounds: Normal breath sounds.  Abdominal:     Palpations: Abdomen is soft.     Tenderness: There is no abdominal tenderness. There is no right CVA tenderness or left CVA tenderness.  Musculoskeletal:     Right lower leg: No edema.     Left lower leg: No edema.  Skin:    General: Skin is warm and dry.     Findings: No erythema or rash.  Neurological:     Mental Status: She is alert and oriented to person, place, and time.  Psychiatric:        Behavior: Behavior normal.     ED Results / Procedures / Treatments   Labs (all labs ordered are listed, but only abnormal results are displayed) Labs Reviewed  COMPREHENSIVE METABOLIC PANEL - Abnormal; Notable for the  following components:      Result Value   Sodium 131 (*)    Potassium 2.8 (*)    Chloride 91 (*)    Glucose, Bld 103 (*)    AST 96 (*)    ALT 234 (*)    Total Bilirubin 1.3 (*)    Anion gap 17 (*)    All other components within normal limits  PROTIME-INR - Abnormal; Notable for the following components:   Prothrombin Time 16.2 (*)    INR 1.3 (*)    All other components within normal limits  CBC WITH DIFFERENTIAL/PLATELET  LIPASE, BLOOD  MAGNESIUM  URINALYSIS, ROUTINE W REFLEX MICROSCOPIC    EKG None  Radiology US Abdomen Limited RUQ (LIVER/GB)  Result Date: 01/07/2023 CLINICAL DATA:  Elevated liver function tests. EXAM: ULTRASOUND ABDOMEN LIMITED RIGHT UPPER QUADRANT COMPARISON:  None Available. FINDINGS: Gallbladder: Biliary sludge in the gallbladder may be consistent with some degree of cholestasis. No shadowing calculi, gallbladder wall thickening or sonographic Claudette Wermuth sign. Common bile duct: Diameter: Normal caliber of 4 mm. Liver: No focal lesion identified. Within normal limits in parenchymal echogenicity. Portal vein is patent on color Doppler imaging with normal direction of blood flow towards the liver. Other: None. IMPRESSION: Biliary sludge in the gallbladder may be consistent with some degree of cholestasis. No evidence of biliary obstruction. Electronically Signed   By: Aletta Edouard M.D.   On: 01/07/2023 14:42   US OB Comp Less 14 Wks  Result Date: 01/07/2023 CLINICAL DATA:  Vaginal bleeding. EXAM: OBSTETRIC <14 WK ULTRASOUND TECHNIQUE: Transabdominal ultrasound was performed for evaluation of the gestation as well as the maternal uterus and adnexal regions. COMPARISON:  December 24, 2022. FINDINGS: Intrauterine gestational sac: Single Yolk sac:  Visualized. Embryo:  Visualized. Cardiac Activity: Visualized. Heart Rate: 171 bpm CRL:   29.2 mm   9 w 5 d                  [place] EDC: August 07, 2023. Subchorionic hemorrhage:  Small subchorionic hemorrhage is noted. Maternal  uterus/adnexae: Ovaries unremarkable. No free fluid is noted. IMPRESSION: Single live intrauterine gestation of 9 weeks 5 days. Small subchorionic hemorrhage is noted. Electronically Signed   By: Marijo Conception M.D.   On: 01/07/2023 13:11    Procedures Procedures    Medications Ordered in ED Medications  potassium chloride 10 mEq in 100 mL IVPB (0 mEq Intravenous Stopped 01/07/23 1520)  sodium chloride 0.9 % bolus 1,000 mL (0 mLs Intravenous Stopped 01/07/23 1336)  sodium chloride 0.9 % bolus 1,000 mL (0 mLs Intravenous  Stopped 01/07/23 1437)  ondansetron (ZOFRAN) injection 4 mg (4 mg Intravenous Given 01/07/23 1337)    ED Course/ Medical Decision Making/ A&P                             Medical Decision Making Amount and/or Complexity of Data Reviewed Labs: ordered. Radiology: ordered.  Risk Prescription drug management.   This patient presents to the ED for concern of vomiting in pregnancy, this involves an extensive number of treatment options, and is a complaint that carries with it a high risk of complications and morbidity.  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  The pertinent results include: CBC with normal WBC, normal platelets.  CMP with mild hypokalemia, potassium is 2.8, AST elevated at 96, ALT elevated 234, total bilirubin elevated at 1.3 with anion gap of 17, normal bicarb.  Lipase within normal notes.  Magnesium within normal limits.   Imaging Studies ordered:  I ordered imaging studies including OB US, RUQ [place]  I independently visualized and interpreted imaging  which showed viable IUP at 9 weeks 5 days. right upper quadrant ultrasound as per report  I agree with the radiologist interpretation   Consultations Obtained:  I requested consultation with the OB, Dr. Jolayne Panther,  and discussed lab and imaging findings as well as pertinent plan - they recommend: admit to MAU   Problem List / ED Course / Critical interventions / Medication management  37 year old female presents at 9 weeks 5 days gestation with significant nausea and vomiting, unable to tolerate anything p.o. for several weeks with report of 30 pound weight loss.  Reports lower abdominal discomfort, has had mild dark spotting throughout her pregnancy.  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  Mucous membranes dry, abdomen soft and nontender.  Labs reveal elevated LFTs with normal WBC and normal platelets.  Followed with ultrasound right upper quadrant with concern for cholestasis, no gallbladder wall thickening or dilated common bile duct.  Afebrile.  Ultrasound confirms viable IUP, small subchorionic hemorrhage.  Case discussed with OB on-call who request transfer to MAU for admission.  Patient was also provided with IV potassium due to hypokalemia and not tolerating p.o.'s.  Discussed results with patient who is concerned regarding her health.  Patient questions if her LFTs are elevated due to prior phentermine use, states that her provider told her that they would have to monitor her LFTs while on this medication and at that time she discontinued the medication. I ordered medication including IV fluids, KCL for dehydration, hypokalemia Reevaluation of the patient after these medicines showed that the patient stayed the same I have reviewed the patients home medicines and have made adjustments as needed   Social Determinants of Health:  Has Ob care, lives with  family   Test / Admission - Considered:  Admit to MAU for further care         Final Clinical Impression(s) / ED Diagnoses Final diagnoses:  Cholestasis during pregnancy in first trimester  Hypokalemia  Subchorionic hemorrhage of placenta in first trimester, single or unspecified fetus    Rx / DC Orders ED Discharge Orders     None         Tacy Learn, PA-C 01/07/23 1713    Horton, Alvin Critchley, DO 01/08/23 1450

## 2023-01-08 ENCOUNTER — Other Ambulatory Visit: Payer: Self-pay | Admitting: Obstetrics and Gynecology

## 2023-01-08 DIAGNOSIS — Z3A09 9 weeks gestation of pregnancy: Secondary | ICD-10-CM | POA: Diagnosis not present

## 2023-01-08 DIAGNOSIS — O99011 Anemia complicating pregnancy, first trimester: Secondary | ICD-10-CM | POA: Diagnosis not present

## 2023-01-08 DIAGNOSIS — O26641 Intrahepatic cholestasis of pregnancy, first trimester: Secondary | ICD-10-CM | POA: Diagnosis not present

## 2023-01-08 DIAGNOSIS — O219 Vomiting of pregnancy, unspecified: Secondary | ICD-10-CM | POA: Diagnosis not present

## 2023-01-08 DIAGNOSIS — O210 Mild hyperemesis gravidarum: Secondary | ICD-10-CM | POA: Diagnosis not present

## 2023-01-08 DIAGNOSIS — E876 Hypokalemia: Secondary | ICD-10-CM | POA: Diagnosis not present

## 2023-01-08 DIAGNOSIS — O99281 Endocrine, nutritional and metabolic diseases complicating pregnancy, first trimester: Secondary | ICD-10-CM | POA: Diagnosis not present

## 2023-01-08 LAB — URINALYSIS, ROUTINE W REFLEX MICROSCOPIC
Bilirubin Urine: NEGATIVE
Glucose, UA: NEGATIVE mg/dL
Hgb urine dipstick: NEGATIVE
Ketones, ur: 80 mg/dL — AB
Leukocytes,Ua: NEGATIVE
Nitrite: NEGATIVE
Protein, ur: 100 mg/dL — AB
Specific Gravity, Urine: 1.028 (ref 1.005–1.030)
pH: 6 (ref 5.0–8.0)

## 2023-01-08 LAB — CBC
HCT: 33.6 % — ABNORMAL LOW (ref 36.0–46.0)
Hemoglobin: 11.3 g/dL — ABNORMAL LOW (ref 12.0–15.0)
MCH: 29.5 pg (ref 26.0–34.0)
MCHC: 33.6 g/dL (ref 30.0–36.0)
MCV: 87.7 fL (ref 80.0–100.0)
Platelets: 258 10*3/uL (ref 150–400)
RBC: 3.83 MIL/uL — ABNORMAL LOW (ref 3.87–5.11)
RDW: 11.9 % (ref 11.5–15.5)
WBC: 7.6 10*3/uL (ref 4.0–10.5)
nRBC: 0 % (ref 0.0–0.2)

## 2023-01-08 LAB — COMPREHENSIVE METABOLIC PANEL
ALT: 181 U/L — ABNORMAL HIGH (ref 0–44)
AST: 75 U/L — ABNORMAL HIGH (ref 15–41)
Albumin: 2.9 g/dL — ABNORMAL LOW (ref 3.5–5.0)
Alkaline Phosphatase: 61 U/L (ref 38–126)
Anion gap: 11 (ref 5–15)
BUN: <5 mg/dL — ABNORMAL LOW (ref 6–20)
CO2: 18 mmol/L — ABNORMAL LOW (ref 22–32)
Calcium: 8.5 mg/dL — ABNORMAL LOW (ref 8.9–10.3)
Chloride: 103 mmol/L (ref 98–111)
Creatinine, Ser: 0.71 mg/dL (ref 0.44–1.00)
GFR, Estimated: >60 mL/min (ref >60)
Glucose, Bld: 111 mg/dL — ABNORMAL HIGH (ref 70–99)
Potassium: 3.1 mmol/L — ABNORMAL LOW (ref 3.5–5.1)
Sodium: 132 mmol/L — ABNORMAL LOW (ref 135–145)
Total Bilirubin: 1 mg/dL (ref 0.3–1.2)
Total Protein: 5.7 g/dL — ABNORMAL LOW (ref 6.5–8.1)

## 2023-01-08 LAB — MAGNESIUM: Magnesium: 1.8 mg/dL (ref 1.7–2.4)

## 2023-01-08 MED ORDER — CALCIUM CARBONATE ANTACID 500 MG PO CHEW
2.0000 | CHEWABLE_TABLET | ORAL | Status: DC | PRN
  Administered 2023-01-08: 400 mg via ORAL
  Filled 2023-01-08: qty 2

## 2023-01-08 MED ORDER — PRENATAL MULTIVITAMIN CH
1.0000 | ORAL_TABLET | Freq: Every day | ORAL | Status: DC
  Filled 2023-01-08: qty 1

## 2023-01-08 MED ORDER — DOXYLAMINE SUCCINATE (SLEEP) 25 MG PO TABS
25.0000 mg | ORAL_TABLET | Freq: Every day | ORAL | Status: DC
  Administered 2023-01-08: 25 mg via ORAL
  Filled 2023-01-08: qty 1

## 2023-01-08 MED ORDER — LACTATED RINGERS IV SOLN: 125.0000 mL/h | INTRAVENOUS | Status: DC

## 2023-01-08 MED ORDER — METOCLOPRAMIDE HCL 5 MG/ML IJ SOLN
10.0000 mg | Freq: Three times a day (TID) | INTRAMUSCULAR | Status: DC
  Administered 2023-01-08 (×2): 10 mg via INTRAVENOUS
  Filled 2023-01-08 (×2): qty 2

## 2023-01-08 MED ORDER — ONDANSETRON 4 MG PO TBDP: 4.0000 mg | ORAL_TABLET | Freq: Four times a day (QID) | ORAL | 2 refills | Status: DC | PRN

## 2023-01-08 MED ORDER — DOXYLAMINE SUCCINATE (SLEEP) 25 MG PO TABS: 25.0000 mg | ORAL_TABLET | Freq: Every day | ORAL | 0 refills | Status: DC

## 2023-01-08 MED ORDER — PROMETHAZINE HCL 25 MG RE SUPP: 25.0000 mg | Freq: Four times a day (QID) | RECTAL | 0 refills | Status: AC | PRN

## 2023-01-08 MED ORDER — DOXYLAMINE SUCCINATE (SLEEP) 25 MG PO TABS: 25.0000 mg | ORAL_TABLET | Freq: Every day | ORAL | 0 refills | Status: AC

## 2023-01-08 MED ORDER — ONDANSETRON 4 MG PO TBDP: 4.0000 mg | ORAL_TABLET | Freq: Four times a day (QID) | ORAL | 2 refills | Status: AC | PRN

## 2023-01-08 MED ORDER — PROMETHAZINE HCL 25 MG RE SUPP: 25.0000 mg | Freq: Four times a day (QID) | RECTAL | Status: DC | PRN

## 2023-01-08 MED ORDER — POTASSIUM CHLORIDE 20 MEQ PO PACK
40.0000 meq | PACK | Freq: Once | ORAL | Status: AC
  Administered 2023-01-08: 40 meq via ORAL
  Filled 2023-01-08: qty 2

## 2023-01-08 MED ORDER — LACTATED RINGERS IV SOLN: INTRAVENOUS | Status: DC

## 2023-01-08 MED ORDER — ONDANSETRON HCL 4 MG/2ML IJ SOLN
4.0000 mg | Freq: Three times a day (TID) | INTRAMUSCULAR | Status: DC
  Administered 2023-01-08 (×2): 4 mg via INTRAVENOUS
  Filled 2023-01-08 (×2): qty 2

## 2023-01-08 MED ORDER — FAMOTIDINE 20 MG PO TABS
20.0000 mg | ORAL_TABLET | Freq: Every day | ORAL | Status: DC
  Administered 2023-01-08: 20 mg via ORAL
  Filled 2023-01-08: qty 1

## 2023-01-08 MED ORDER — LACTATED RINGERS IV BOLUS
1000.0000 mL | Freq: Once | INTRAVENOUS | Status: AC
  Administered 2023-01-08: 1000 mL via INTRAVENOUS

## 2023-01-08 MED ORDER — VITAMIN B-6 25 MG PO TABS
25.0000 mg | ORAL_TABLET | Freq: Three times a day (TID) | ORAL | Status: DC
  Administered 2023-01-08 (×2): 25 mg via ORAL
  Filled 2023-01-08 (×2): qty 1

## 2023-01-08 MED ORDER — DOCUSATE SODIUM 100 MG PO CAPS
100.0000 mg | ORAL_CAPSULE | Freq: Every day | ORAL | Status: DC
  Administered 2023-01-08: 100 mg via ORAL
  Filled 2023-01-08: qty 1

## 2023-01-08 MED ORDER — ACETAMINOPHEN 325 MG PO TABS: 650.0000 mg | ORAL_TABLET | ORAL | Status: DC | PRN

## 2023-01-08 NOTE — H&P (Signed)
[facility] ANTEPARTUM ADMISSION HISTORY AND PHYSICAL NOTE   History of Present Illness: AARIAH GODETTE is a 37 y.o. [phone] at [redacted]w[redacted]d admitted for hyperemesis gravidarum  Presented to [place] on 1/31 with generalized weakness, nausea and vomiting with inability to tolerate PO. She was taking zofran and phenergan without relief. She also reported spotting.  In ED, she was afebrile, HDS and nontachycardic. Abdomen was soft and nontender; exam overall nonfocal. Serum labs w/ no leukocytosis or anemia; CMP notable for mild transaminitis and electrolyte derangements (Na 131, K 2.8, Cl 91, CO 23, glu 103, Mg 1.7, AST 96, ALT 234, Tbili 1.3). Lipase 25. OBUS w/ SLIUP & small subchorionic hematoma. RUQ [place] w/ biliary sludge.  She received 2L IVF, zofran 4mg  x1, mylanta, pepcid, levsin, & 27mEq of K in ED with only partial improvement in her symptoms. She was transferred to [facility] for further management.   Here, she reports her symptoms have significantly improved. She reports that her appetite has returned and requests something to eat. Reports that she has not been urinating normally. No abdominal pain, fevers or chills.  Reports a 25lb WL this pregnancy.  Patient Active Problem List   Diagnosis Date Noted   Nausea and vomiting during pregnancy prior to [redacted] weeks gestation 01/08/2023   Supervision of other normal pregnancy, antepartum 12/24/2022   Trichomonas infection 01/08/2021   Past Medical History:  Diagnosis Date   Anxiety    Depression    Past Surgical History:  Procedure Laterality Date   DILATION AND CURETTAGE OF UTERUS  2017   DILATION AND CURETTAGE OF UTERUS  2018   TONSILLECTOMY     OB History  Gravida Para Term Preterm AB Living  9 5 4 1 3 4   SAB IAB Ectopic Multiple Live Births  2 1     4     # Outcome Date GA Lbr Len/2nd Weight Sex Delivery Anes PTL Lv  9 Gravida           8 IAB 2020 [redacted]w[redacted]d    TAB     7 SAB 10/2017          6 SAB 12/25/16 [redacted]w[redacted]d      N   5 Preterm  2017 [redacted]w[redacted]d       FD  4 Term 11/05/14     Vag-Spont     3 Term 01/22/11     Vag-Spont     2 Term 10/01/06     Vag-Spont     1 Term 06/05/01     Vag-Spont      Social History   Socioeconomic History   Marital status: Single    Spouse name: Not on file   Number of children: Not on file   Years of education: Not on file   Highest education level: Not on file  Occupational History   Not on file  Tobacco Use   Smoking status: Former    Types: Cigarettes    Quit date: 09/2022    Years since quitting: 0.3   Smokeless tobacco: Never  Vaping Use   Vaping Use: Never used  Substance and Sexual Activity   Alcohol use: Not Currently    Comment: occ prior to pregnancy   Drug use: No   Sexual activity: Yes    Partners: Male    Birth control/protection: None  Other Topics Concern   Not on file  Social History Narrative   Not on file   Social Determinants of Health   Financial Resource Strain:  Not on file  Food Insecurity: Not on file  Transportation Needs: Not on file  Physical Activity: Not on file  Stress: Not on file  Social Connections: Not on file   Family History  Problem Relation Age of Onset   Hypertension Mother    COPD Mother    No Known Allergies  Medications Prior to Admission  Medication Sig Dispense Refill Last Dose   Blood Pressure Monitoring (BLOOD PRESSURE KIT) DEVI 1 kit by Does not apply route once a week. 1 each 0    metoCLOPramide (REGLAN) 5 MG tablet Take 1 tablet (5 mg total) by mouth every 8 (eight) hours as needed for nausea or vomiting. 15 tablet 0    metroNIDAZOLE (FLAGYL) 500 MG tablet Take 1 tablet (500 mg total) by mouth 2 (two) times daily. 14 tablet 0    Misc. Devices (GOJJI WEIGHT SCALE) MISC 1 Device by Does not apply route every 30 (thirty) days. 1 each 0    phentermine (ADIPEX-P) 37.5 MG tablet Take 37.5 mg by mouth every morning.      Prenat-Fe Poly-Methfol-FA-DHA (VITAFOL ULTRA) 29-0.6-0.4-200 MG CAPS Take 1 capsule by mouth daily. 30  capsule 11    Prenatal Vit-Fe Fumarate-FA (PRENATAL COMPLETE) 14-0.4 MG TABS Take 0.4 mg by mouth daily. 30 tablet 0    promethazine (PHENERGAN) 25 MG tablet Take 1 tablet (25 mg total) by mouth every 6 (six) hours as needed for nausea or vomiting. 30 tablet 2    Vitamin D, Ergocalciferol, (DRISDOL) 1.25 MG (50000 UNIT) CAPS capsule Take 50,000 Units by mouth once a week.      Review of Systems - Negative except as noted in HPI  Vitals:  BP (!) 93/57   Pulse 92   Temp 98.4 F (36.9 C) (Oral)   Resp 18   Ht 5' 1.5" (1.562 m)   Wt 81.2 kg   LMP  (LMP Unknown)   SpO2 100%   Breastfeeding Unknown   BMI 33.27 kg/m  Physical Examination: CONSTITUTIONAL: Well-developed, well-nourished female in no acute distress.  NECK: Normal range of motion SKIN: Skin is warm and dry. No rash noted. Not diaphoretic. No erythema. No pallor. NEUROLOGIC: Alert and oriented to person, place, and time.  PSYCHIATRIC: Normal mood and affect. Normal behavior. Normal judgment and thought content. CARDIOVASCULAR: Normal heart rate noted RESPIRATORY: Effort normal, no problems with respiration noted ABDOMEN: Soft, nontender, nondistended  Labs:  Results for orders placed or performed during the hospital encounter of 01/07/23 (from the past 24 hour(s))  CBC with Differential   Collection Time: 01/07/23 12:05 PM  Result Value Ref Range   WBC 6.4 4.0 - 10.5 K/uL   RBC 4.63 3.87 - 5.11 MIL/uL   Hemoglobin 13.8 12.0 - 15.0 g/dL   HCT 39.4 36.0 - 46.0 %   MCV 85.1 80.0 - 100.0 fL   MCH 29.8 26.0 - 34.0 pg   MCHC 35.0 30.0 - 36.0 g/dL   RDW 11.7 11.5 - 15.5 %   Platelets 358 150 - 400 K/uL   nRBC 0.0 0.0 - 0.2 %   Neutrophils Relative % 63 %   Neutro Abs 4.0 1.7 - 7.7 K/uL   Lymphocytes Relative 23 %   Lymphs Abs 1.5 0.7 - 4.0 K/uL   Monocytes Relative 13 %   Monocytes Absolute 0.8 0.1 - 1.0 K/uL   Eosinophils Relative 1 %   Eosinophils Absolute 0.1 0.0 - 0.5 K/uL   Basophils Relative 0 %   Basophils  Absolute  0.0 0.0 - 0.1 K/uL   Immature Granulocytes 0 %   Abs Immature Granulocytes 0.02 0.00 - 0.07 K/uL  Comprehensive metabolic panel   Collection Time: 01/07/23 12:05 PM  Result Value Ref Range   Sodium 131 (L) 135 - 145 mmol/L   Potassium 2.8 (L) 3.5 - 5.1 mmol/L   Chloride 91 (L) 98 - 111 mmol/L   CO2 23 22 - 32 mmol/L   Glucose, Bld 103 (H) 70 - 99 mg/dL   BUN 12 6 - 20 mg/dL   Creatinine, Ser 0.79 0.44 - 1.00 mg/dL   Calcium 10.2 8.9 - 10.3 mg/dL   Total Protein 7.6 6.5 - 8.1 g/dL   Albumin 4.4 3.5 - 5.0 g/dL   AST 96 (H) 15 - 41 U/L   ALT 234 (H) 0 - 44 U/L   Alkaline Phosphatase 86 38 - 126 U/L   Total Bilirubin 1.3 (H) 0.3 - 1.2 mg/dL   GFR, Estimated >60 >60 mL/min   Anion gap 17 (H) 5 - 15  Lipase, blood   Collection Time: 01/07/23  1:14 PM  Result Value Ref Range   Lipase 25 11 - 51 U/L  Protime-INR   Collection Time: 01/07/23  1:14 PM  Result Value Ref Range   Prothrombin Time 16.2 (H) 11.4 - 15.2 seconds   INR 1.3 (H) 0.8 - 1.2  Magnesium   Collection Time: 01/07/23  1:14 PM  Result Value Ref Range   Magnesium 1.7 1.7 - 2.4 mg/dL   Imaging Studies: US Abdomen Limited RUQ (LIVER/GB)  Result Date: 01/07/2023 CLINICAL DATA:  Elevated liver function tests. EXAM: ULTRASOUND ABDOMEN LIMITED RIGHT UPPER QUADRANT COMPARISON:  None Available. FINDINGS: Gallbladder: Biliary sludge in the gallbladder may be consistent with some degree of cholestasis. No shadowing calculi, gallbladder wall thickening or sonographic Murphy sign. Common bile duct: Diameter: Normal caliber of 4 mm. Liver: No focal lesion identified. Within normal limits in parenchymal echogenicity. Portal vein is patent on color Doppler imaging with normal direction of blood flow towards the liver. Other: None. IMPRESSION: Biliary sludge in the gallbladder may be consistent with some degree of cholestasis. No evidence of biliary obstruction. Electronically Signed   By: Aletta Edouard M.D.   On: 01/07/2023  14:42   US OB Comp Less 14 Wks  Result Date: 01/07/2023 CLINICAL DATA:  Vaginal bleeding. EXAM: OBSTETRIC <14 WK ULTRASOUND TECHNIQUE: Transabdominal ultrasound was performed for evaluation of the gestation as well as the maternal uterus and adnexal regions. COMPARISON:  December 24, 2022. FINDINGS: Intrauterine gestational sac: Single Yolk sac:  Visualized. Embryo:  Visualized. Cardiac Activity: Visualized. Heart Rate: 171 bpm CRL:   29.2 mm   9 w 5 d                  [place] EDC: August 07, 2023. Subchorionic hemorrhage:  Small subchorionic hemorrhage is noted. Maternal uterus/adnexae: Ovaries unremarkable. No free fluid is noted. IMPRESSION: Single live intrauterine gestation of 9 weeks 5 days. Small subchorionic hemorrhage is noted. Electronically Signed   By: Marijo Conception M.D.   On: 01/07/2023 13:11     Assessment and Plan: Patient Active Problem List   Diagnosis Date Noted   Nausea and vomiting during pregnancy prior to [redacted] weeks gestation 01/08/2023   Supervision of other normal pregnancy, antepartum 12/24/2022   Trichomonas infection 01/08/2021   36yo [id number] at [redacted]w[redacted]d a/f hyperemesis gravidarum  Hyperemesis gravidarum - Repeat CMP & UA, replete electrolytes prn - EKG for assessment of QTc -  B6, unisom, pepcid, scheduled IV zofran q8h & IV reglan q8h with PR phenergan prn for breakthrough nausea/vomiting. Anticipate transition to PO antiemetics in AM -- will need to discuss with pharmacy which pills can be cut/crushed so pt can continue taking them on discharge - Regular diet  Routine antenatal care  Harvie Bridge, MD Obstetrician & Gynecologist, Faculty Practice Faculty Practice, [facility]

## 2023-01-08 NOTE — H&P (Incomplete)
[facility]AC[facility] PRACTICE ANTEPARTUM ADMISSION HISTORY AND PHYSICAL NOTE   History of Present Illness: Mikayla Cabrera is a 37 y.o. [phone] at [redacted]w[redacted]d admitted for nausea and vomiting of pregnancy        Patient Active Problem List   Diagnosis Date Noted  . Nausea and vomiting during pregnancy prior to [redacted] weeks gestation 01/08/2023  . Cholestasis during pregnancy in first trimester 01/07/2023  . Supervision of other normal pregnancy, antepartum 12/24/2022  . Trichomonas infection 01/08/2021   Past Medical History:  Diagnosis Date  . Anxiety   . Depression    Past Surgical History:  Procedure Laterality Date  . DILATION AND CURETTAGE OF UTERUS  2017  . DILATION AND CURETTAGE OF UTERUS  2018  . TONSILLECTOMY     OB History  Gravida Para Term Preterm AB Living  9 5 4 1 3 4   SAB IAB Ectopic Multiple Live Births  2 1     4     # Outcome Date GA Lbr Len/2nd Weight Sex Delivery Anes PTL Lv  9 Gravida           8 IAB 2020 [redacted]w[redacted]d    TAB     7 SAB 10/2017          6 SAB 12/25/16 [redacted]w[redacted]d      N   5 Preterm 2017 [redacted]w[redacted]d       FD  4 Term 11/05/14     Vag-Spont     3 Term 01/22/11     Vag-Spont     2 Term 10/01/06     Vag-Spont     1 Term 06/05/01     Vag-Spont      Social History   Socioeconomic History  . Marital status: Single    Spouse name: Not on file  . Number of children: Not on file  . Years of education: Not on file  . Highest education level: Not on file  Occupational History  . Not on file  Tobacco Use  . Smoking status: Former    Types: Cigarettes    Quit date: 09/2022    Years since quitting: 0.3  . Smokeless tobacco: Never  Vaping Use  . Vaping Use: Never used  Substance and Sexual Activity  . Alcohol use: Not Currently    Comment: occ prior to pregnancy  . Drug use: No  . Sexual activity: Yes    Partners: Male    Birth control/protection: None  Other Topics Concern  . Not on file  Social History Narrative  . Not on file   Social Determinants of Health    Financial Resource Strain: Not on file  Food Insecurity: Not on file  Transportation Needs: Not on file  Physical Activity: Not on file  Stress: Not on file  Social Connections: Not on file   Family History  Problem Relation Age of Onset  . Hypertension Mother   . COPD Mother    No Known Allergies  Medications Prior to Admission  Medication Sig Dispense Refill Last Dose  . Blood Pressure Monitoring (BLOOD PRESSURE KIT) DEVI 1 kit by Does not apply route once a week. 1 each 0   . metoCLOPramide (REGLAN) 5 MG tablet Take 1 tablet (5 mg total) by mouth every 8 (eight) hours as needed for nausea or vomiting. 15 tablet 0   . metroNIDAZOLE (FLAGYL) 500 MG tablet Take 1 tablet (500 mg total) by mouth 2 (two) times daily. 14 tablet 0   . Misc. Devices (GOJJI WEIGHT  SCALE) MISC 1 Device by Does not apply route every 30 (thirty) days. 1 each 0   . phentermine (ADIPEX-P) 37.5 MG tablet Take 37.5 mg by mouth every morning.     . Prenat-Fe Poly-Methfol-FA-DHA (VITAFOL ULTRA) 29-0.6-0.4-200 MG CAPS Take 1 capsule by mouth daily. 30 capsule 11   . Prenatal Vit-Fe Fumarate-FA (PRENATAL COMPLETE) 14-0.4 MG TABS Take 0.4 mg by mouth daily. 30 tablet 0   . promethazine (PHENERGAN) 25 MG tablet Take 1 tablet (25 mg total) by mouth every 6 (six) hours as needed for nausea or vomiting. 30 tablet 2   . Vitamin D, Ergocalciferol, (DRISDOL) 1.25 MG (50000 UNIT) CAPS capsule Take 50,000 Units by mouth once a week.      Review of Systems - {ros master:310782}  Vitals:  BP (!) 93/57   Pulse 92   Temp 98.4 F (36.9 C) (Oral)   Resp 18   Ht 5' 1.5" (1.562 m)   Wt 81.2 kg   LMP  (LMP Unknown)   SpO2 100%   Breastfeeding Unknown   BMI 33.27 kg/m  Physical Examination: CONSTITUTIONAL: Well-developed, well-nourished female in no acute distress.  HENT:  Normocephalic, atraumatic, External right and left ear normal. Oropharynx is clear and moist EYES: Conjunctivae and EOM are normal. Pupils are equal,  round, and reactive to light. No scleral icterus.  NECK: Normal range of motion, supple, no masses SKIN: Skin is warm and dry. No rash noted. Not diaphoretic. No erythema. No pallor. NEUROLOGIC: Alert and oriented to person, place, and time. Normal reflexes, muscle tone coordination. No cranial nerve deficit noted. PSYCHIATRIC: Normal mood and affect. Normal behavior. Normal judgment and thought content. CARDIOVASCULAR: Normal heart rate noted, regular rhythm RESPIRATORY: Effort and breath sounds normal, no problems with respiration noted ABDOMEN: Soft, nontender, nondistended, gravid. MUSCULOSKELETAL: Normal range of motion. No edema and no tenderness. 2+ distal pulses.  Cervix: {Pe_cervix_labor:31295} and found to be {:31290}/ {Ob effacement:14523}/{station:14562} and fetal presentation is {desc; fetal presentation:14558}. Membranes:{:314523} Fetal Monitoring:{findings; monitor fetal heart monitor:31527} Tocometer: ***Flat  Labs:  Results for orders placed or performed during the hospital encounter of 01/07/23 (from the past 24 hour(s))  CBC with Differential   Collection Time: 01/07/23 12:05 PM  Result Value Ref Range   WBC 6.4 4.0 - 10.5 K/uL   RBC 4.63 3.87 - 5.11 MIL/uL   Hemoglobin 13.8 12.0 - 15.0 g/dL   HCT 39.4 36.0 - 46.0 %   MCV 85.1 80.0 - 100.0 fL   MCH 29.8 26.0 - 34.0 pg   MCHC 35.0 30.0 - 36.0 g/dL   RDW 11.7 11.5 - 15.5 %   Platelets 358 150 - 400 K/uL   nRBC 0.0 0.0 - 0.2 %   Neutrophils Relative % 63 %   Neutro Abs 4.0 1.7 - 7.7 K/uL   Lymphocytes Relative 23 %   Lymphs Abs 1.5 0.7 - 4.0 K/uL   Monocytes Relative 13 %   Monocytes Absolute 0.8 0.1 - 1.0 K/uL   Eosinophils Relative 1 %   Eosinophils Absolute 0.1 0.0 - 0.5 K/uL   Basophils Relative 0 %   Basophils Absolute 0.0 0.0 - 0.1 K/uL   Immature Granulocytes 0 %   Abs Immature Granulocytes 0.02 0.00 - 0.07 K/uL  Comprehensive metabolic panel   Collection Time: 01/07/23 12:05 PM  Result Value Ref  Range   Sodium 131 (L) 135 - 145 mmol/L   Potassium 2.8 (L) 3.5 - 5.1 mmol/L   Chloride 91 (L) 98 - 111 mmol/L  CO2 23 22 - 32 mmol/L   Glucose, Bld 103 (H) 70 - 99 mg/dL   BUN 12 6 - 20 mg/dL   Creatinine, Ser 0.79 0.44 - 1.00 mg/dL   Calcium 10.2 8.9 - 10.3 mg/dL   Total Protein 7.6 6.5 - 8.1 g/dL   Albumin 4.4 3.5 - 5.0 g/dL   AST 96 (H) 15 - 41 U/L   ALT 234 (H) 0 - 44 U/L   Alkaline Phosphatase 86 38 - 126 U/L   Total Bilirubin 1.3 (H) 0.3 - 1.2 mg/dL   GFR, Estimated >60 >60 mL/min   Anion gap 17 (H) 5 - 15  Lipase, blood   Collection Time: 01/07/23  1:14 PM  Result Value Ref Range   Lipase 25 11 - 51 U/L  Protime-INR   Collection Time: 01/07/23  1:14 PM  Result Value Ref Range   Prothrombin Time 16.2 (H) 11.4 - 15.2 seconds   INR 1.3 (H) 0.8 - 1.2  Magnesium   Collection Time: 01/07/23  1:14 PM  Result Value Ref Range   Magnesium 1.7 1.7 - 2.4 mg/dL    Imaging Studies: US Abdomen Limited RUQ (LIVER/GB)  Result Date: 01/07/2023 CLINICAL DATA:  Elevated liver function tests. EXAM: ULTRASOUND ABDOMEN LIMITED RIGHT UPPER QUADRANT COMPARISON:  None Available. FINDINGS: Gallbladder: Biliary sludge in the gallbladder may be consistent with some degree of cholestasis. No shadowing calculi, gallbladder wall thickening or sonographic Murphy sign. Common bile duct: Diameter: Normal caliber of 4 mm. Liver: No focal lesion identified. Within normal limits in parenchymal echogenicity. Portal vein is patent on color Doppler imaging with normal direction of blood flow towards the liver. Other: None. IMPRESSION: Biliary sludge in the gallbladder may be consistent with some degree of cholestasis. No evidence of biliary obstruction. Electronically Signed   By: Aletta Edouard M.D.   On: 01/07/2023 14:42   US OB Comp Less 14 Wks  Result Date: 01/07/2023 CLINICAL DATA:  Vaginal bleeding. EXAM: OBSTETRIC <14 WK ULTRASOUND TECHNIQUE: Transabdominal ultrasound was performed for evaluation of  the gestation as well as the maternal uterus and adnexal regions. COMPARISON:  December 24, 2022. FINDINGS: Intrauterine gestational sac: Single Yolk sac:  Visualized. Embryo:  Visualized. Cardiac Activity: Visualized. Heart Rate: 171 bpm CRL:   29.2 mm   9 w 5 d                  [place] EDC: August 07, 2023. Subchorionic hemorrhage:  Small subchorionic hemorrhage is noted. Maternal uterus/adnexae: Ovaries unremarkable. No free fluid is noted. IMPRESSION: Single live intrauterine gestation of 9 weeks 5 days. Small subchorionic hemorrhage is noted. Electronically Signed   By: Marijo Conception M.D.   On: 01/07/2023 13:11     Assessment and Plan: Patient Active Problem List   Diagnosis Date Noted  . Nausea and vomiting during pregnancy prior to [redacted] weeks gestation 01/08/2023  . Cholestasis during pregnancy in first trimester 01/07/2023  . Supervision of other normal pregnancy, antepartum 12/24/2022  . Trichomonas infection 01/08/2021   Admit to Antenatal Betamethasone x 2 doses Magnesium sulfate for CP prophylaxis Will recheck presentation if she does progress in preterm labor to determine route of delivery Routine antenatal care  Gale Journey, MD [facility], [place]

## 2023-01-08 NOTE — Discharge Summary (Signed)
Patient ID: Mikayla Cabrera MRN: [id number] DOB/AGE: Jan 11, 1986 37 y.o.  Admit date: 01/07/2023 Discharge date: 01/08/2023  Admission Diagnoses: IUP 9w 5d, hyperemesis  Discharge Diagnoses: SAA  Prenatal Procedures: ultrasound  Consults: None  Hospital Course:  This is a 38 y.o. [id number] with IUP at [redacted]w[redacted]d admitted for hyperemesis. See admit H & P for additional information. She received IV fluids for hydration and antiemetics. Here N/V resolved and was able to tolerate a regular diet. Electrolytes were corrected. She was up to restroom without problems. Switch to oral antiemetics. Felt amendable for discharge home. Discharge instructions, medications and follow up were reviewed with pt. She verbalized understanding.  table for discharge to home with outpatient follow up.  Discharge Exam: Temp:  [98 F (36.7 C)-98.7 F (37.1 C)] 98 F (36.7 C) (02/01 0804) Pulse Rate:  [74-110] 88 (02/01 0804) Resp:  [10-24] 18 (02/01 0804) BP: (93-111)/(57-75) 105/58 (02/01 0804) SpO2:  [96 %-100 %] 96 % (02/01 0805) Physical Examination: CONSTITUTIONAL: Well-developed, well-nourished female in no acute distress.  HENT:  Normocephalic, atraumatic, External right and left ear normal. Oropharynx is clear and moist EYES: Conjunctivae and EOM are normal. Pupils are equal, round, and reactive to light. No scleral icterus.  NECK: Normal range of motion, supple, no masses SKIN: Skin is warm and dry. No rash noted. Not diaphoretic. No erythema. No pallor. [place]: Alert and oriented to person, place, and time. Normal reflexes, muscle tone coordination. No cranial nerve deficit noted. PSYCHIATRIC: Normal mood and affect. Normal behavior. Normal judgment and thought content. CARDIOVASCULAR: Normal heart rate noted, regular rhythm RESPIRATORY: Effort and breath sounds normal, no problems with respiration noted MUSCULOSKELETAL: Normal range of motion. No edema and no tenderness. 2+ distal pulses. ABDOMEN:  Soft, nontender, nondistended, gravid. CERVIX:  Deffered  Significant Diagnostic Studies:  Results for orders placed or performed during the hospital encounter of 01/07/23 (from the past 168 hour(s))  CBC with Differential   Collection Time: 01/07/23 12:05 PM  Result Value Ref Range   WBC 6.4 4.0 - 10.5 K/uL   RBC 4.63 3.87 - 5.11 MIL/uL   Hemoglobin 13.8 12.0 - 15.0 g/dL   HCT 39.4 36.0 - 46.0 %   MCV 85.1 80.0 - 100.0 fL   MCH 29.8 26.0 - 34.0 pg   MCHC 35.0 30.0 - 36.0 g/dL   RDW 11.7 11.5 - 15.5 %   Platelets 358 150 - 400 K/uL   nRBC 0.0 0.0 - 0.2 %   Neutrophils Relative % 63 %   Neutro Abs 4.0 1.7 - 7.7 K/uL   Lymphocytes Relative 23 %   Lymphs Abs 1.5 0.7 - 4.0 K/uL   Monocytes Relative 13 %   Monocytes Absolute 0.8 0.1 - 1.0 K/uL   Eosinophils Relative 1 %   Eosinophils Absolute 0.1 0.0 - 0.5 K/uL   Basophils Relative 0 %   Basophils Absolute 0.0 0.0 - 0.1 K/uL   Immature Granulocytes 0 %   Abs Immature Granulocytes 0.02 0.00 - 0.07 K/uL  Comprehensive metabolic panel   Collection Time: 01/07/23 12:05 PM  Result Value Ref Range   Sodium 131 (L) 135 - 145 mmol/L   Potassium 2.8 (L) 3.5 - 5.1 mmol/L   Chloride 91 (L) 98 - 111 mmol/L   CO2 23 22 - 32 mmol/L   Glucose, Bld 103 (H) 70 - 99 mg/dL   BUN 12 6 - 20 mg/dL   Creatinine, Ser 0.79 0.44 - 1.00 mg/dL   Calcium 10.2 8.9 - 10.3  mg/dL   Total Protein 7.6 6.5 - 8.1 g/dL   Albumin 4.4 3.5 - 5.0 g/dL   AST 96 (H) 15 - 41 U/L   ALT 234 (H) 0 - 44 U/L   Alkaline Phosphatase 86 38 - 126 U/L   Total Bilirubin 1.3 (H) 0.3 - 1.2 mg/dL   GFR, Estimated >60 >60 mL/min   Anion gap 17 (H) 5 - 15  Lipase, blood   Collection Time: 01/07/23  1:14 PM  Result Value Ref Range   Lipase 25 11 - 51 U/L  Protime-INR   Collection Time: 01/07/23  1:14 PM  Result Value Ref Range   Prothrombin Time 16.2 (H) 11.4 - 15.2 seconds   INR 1.3 (H) 0.8 - 1.2  Magnesium   Collection Time: 01/07/23  1:14 PM  Result Value Ref Range    Magnesium 1.7 1.7 - 2.4 mg/dL  Type and screen [place]   Collection Time: 01/08/23  1:45 AM  Result Value Ref Range   ABO/RH(D) B POS    Antibody Screen NEG    Sample Expiration      01/11/2023,2359 Performed at [facility], [place] [address], [place], [place] [address]   Comprehensive metabolic panel   Collection Time: 01/08/23  1:49 AM  Result Value Ref Range   Sodium 132 (L) 135 - 145 mmol/L   Potassium 3.1 (L) 3.5 - 5.1 mmol/L   Chloride 103 98 - 111 mmol/L   CO2 18 (L) 22 - 32 mmol/L   Glucose, Bld 111 (H) 70 - 99 mg/dL   BUN <5 (L) 6 - 20 mg/dL   Creatinine, Ser 0.71 0.44 - 1.00 mg/dL   Calcium 8.5 (L) 8.9 - 10.3 mg/dL   Total Protein 5.7 (L) 6.5 - 8.1 g/dL   Albumin 2.9 (L) 3.5 - 5.0 g/dL   AST 75 (H) 15 - 41 U/L   ALT 181 (H) 0 - 44 U/L   Alkaline Phosphatase 61 38 - 126 U/L   Total Bilirubin 1.0 0.3 - 1.2 mg/dL   GFR, Estimated >60 >60 mL/min   Anion gap 11 5 - 15  Magnesium   Collection Time: 01/08/23  1:49 AM  Result Value Ref Range   Magnesium 1.8 1.7 - 2.4 mg/dL  CBC   Collection Time: 01/08/23  1:49 AM  Result Value Ref Range   WBC 7.6 4.0 - 10.5 K/uL   RBC 3.83 (L) 3.87 - 5.11 MIL/uL   Hemoglobin 11.3 (L) 12.0 - 15.0 g/dL   HCT 33.6 (L) 36.0 - 46.0 %   MCV 87.7 80.0 - 100.0 fL   MCH 29.5 26.0 - 34.0 pg   MCHC 33.6 30.0 - 36.0 g/dL   RDW 11.9 11.5 - 15.5 %   Platelets 258 150 - 400 K/uL   nRBC 0.0 0.0 - 0.2 %  Urinalysis, Routine w reflex microscopic -Urine, Clean Catch   Collection Time: 01/08/23  2:24 AM  Result Value Ref Range   Color, Urine AMBER (A) YELLOW   APPearance CLOUDY (A) CLEAR   Specific Gravity, Urine 1.028 1.005 - 1.030   pH 6.0 5.0 - 8.0   Glucose, UA NEGATIVE NEGATIVE mg/dL   Hgb urine dipstick NEGATIVE NEGATIVE   Bilirubin Urine NEGATIVE NEGATIVE   Ketones, ur 80 (A) NEGATIVE mg/dL   Protein, ur 100 (A) NEGATIVE mg/dL   Nitrite NEGATIVE NEGATIVE   Leukocytes,Ua NEGATIVE NEGATIVE   RBC / HPF 0-5 0 - 5  RBC/hpf   WBC,  UA 6-10 0 - 5 WBC/hpf   Bacteria, UA FEW (A) NONE SEEN   Squamous Epithelial / HPF 6-10 0 - 5 /HPF   Mucus PRESENT    Hyaline Casts, UA PRESENT     Discharge Condition: Stable  Disposition: Discharge disposition: 01-Home or Self Care        Discharge Instructions     Discharge activity:  No Restrictions   Complete by: As directed    Discharge diet:  No restrictions   Complete by: As directed    No sexual activity restrictions   Complete by: As directed       Allergies as of 01/08/2023   No Known Allergies      Medication List     STOP taking these medications    phentermine 37.5 MG tablet Commonly known as: ADIPEX-P   Prenatal Complete 14-0.4 MG Tabs   Vitamin D (Ergocalciferol) 1.25 MG (50000 UNIT) Caps capsule Commonly known as: DRISDOL       TAKE these medications    Blood Pressure Kit Devi 1 kit by Does not apply route once a week.   doxylamine (Sleep) 25 MG tablet Commonly known as: UNISOM Take 1 tablet (25 mg total) by mouth at bedtime.   Gojji Weight Scale Misc 1 Device by Does not apply route every 30 (thirty) days.   metoCLOPramide 5 MG tablet Commonly known as: Reglan Take 1 tablet (5 mg total) by mouth every 8 (eight) hours as needed for nausea or vomiting.   metroNIDAZOLE 500 MG tablet Commonly known as: Flagyl Take 1 tablet (500 mg total) by mouth 2 (two) times daily.   ondansetron 4 MG disintegrating tablet Commonly known as: ZOFRAN-ODT Take 1 tablet (4 mg total) by mouth every 6 (six) hours as needed for nausea.   promethazine 25 MG tablet Commonly known as: PHENERGAN Take 1 tablet (25 mg total) by mouth every 6 (six) hours as needed for nausea or vomiting. What changed: Another medication with the same name was added. Make sure you understand how and when to take each.   promethazine 25 MG suppository Commonly known as: PHENERGAN Place 1 suppository (25 mg total) rectally every 6 (six) hours as needed for  nausea or vomiting. What changed: You were already taking a medication with the same name, and this prescription was added. Make sure you understand how and when to take each.   Vitafol Ultra 29-0.6-0.4-200 MG Caps Take 1 capsule by mouth daily.        Follow-up Information     [facility].   Contact information: [address] [place] [address] [phone]                Signed: Chancy Milroy M.D. 01/08/2023, 2:10 PM

## 2023-01-09 LAB — TYPE AND SCREEN
ABO/RH(D): B POS
Antibody Screen: NEGATIVE

## 2023-01-16 ENCOUNTER — Encounter: Payer: Self-pay | Admitting: Obstetrics

## 2023-01-16 ENCOUNTER — Ambulatory Visit (INDEPENDENT_AMBULATORY_CARE_PROVIDER_SITE_OTHER): Payer: Medicaid Other | Admitting: Obstetrics

## 2023-01-16 ENCOUNTER — Institutional Professional Consult (permissible substitution): Payer: Medicaid Other | Admitting: Licensed Clinical Social Worker

## 2023-01-16 VITALS — BP 101/63 | HR 84 | Wt 201.6 lb

## 2023-01-16 DIAGNOSIS — Z3A11 11 weeks gestation of pregnancy: Secondary | ICD-10-CM | POA: Diagnosis not present

## 2023-01-16 DIAGNOSIS — Z3481 Encounter for supervision of other normal pregnancy, first trimester: Secondary | ICD-10-CM

## 2023-01-16 DIAGNOSIS — Z3201 Encounter for pregnancy test, result positive: Secondary | ICD-10-CM

## 2023-01-16 NOTE — Progress Notes (Signed)
Patient ID: Mikayla Cabrera, female   DOB: 11/16/1986, 37 y.o.   MRN: [id number]  Chief Complaint  Patient presents with   Initial Prenatal Visit    HPI Mikayla Cabrera is a 37 y.o. female.  Patient has missed periods, and feels that she is early pregnant.  Considering prenatal care here. HPI  Past Medical History:  Diagnosis Date   Anxiety    Depression     Past Surgical History:  Procedure Laterality Date   DILATION AND CURETTAGE OF UTERUS  2017   DILATION AND CURETTAGE OF UTERUS  2018   TONSILLECTOMY      Family History  Problem Relation Age of Onset   Hypertension Mother    COPD Mother     Social History Social History   Tobacco Use   Smoking status: Former    Types: Cigarettes    Quit date: 09/2022    Years since quitting: 0.3   Smokeless tobacco: Never  Vaping Use   Vaping Use: Never used  Substance Use Topics   Alcohol use: Not Currently    Comment: occ prior to pregnancy   Drug use: No    No Known Allergies  Current Outpatient Medications  Medication Sig Dispense Refill   Prenat-Fe Poly-Methfol-FA-DHA (VITAFOL ULTRA) 29-0.6-0.4-200 MG CAPS Take 1 capsule by mouth daily. 30 capsule 11   Blood Pressure Monitoring (BLOOD PRESSURE KIT) DEVI 1 kit by Does not apply route once a week. (Patient not taking: Reported on 01/16/2023) 1 each 0   doxylamine, Sleep, (UNISOM) 25 MG tablet Take 1 tablet (25 mg total) by mouth at bedtime. (Patient not taking: Reported on 01/16/2023) 30 tablet 0   metoCLOPramide (REGLAN) 5 MG tablet Take 1 tablet (5 mg total) by mouth every 8 (eight) hours as needed for nausea or vomiting. (Patient not taking: Reported on 01/16/2023) 15 tablet 0   metroNIDAZOLE (FLAGYL) 500 MG tablet Take 1 tablet (500 mg total) by mouth 2 (two) times daily. (Patient not taking: Reported on 01/16/2023) 14 tablet 0   Misc. Devices (GOJJI WEIGHT SCALE) MISC 1 Device by Does not apply route every 30 (thirty) days. (Patient not taking: Reported on 01/16/2023) 1 each  0   ondansetron (ZOFRAN-ODT) 4 MG disintegrating tablet Take 1 tablet (4 mg total) by mouth every 6 (six) hours as needed for nausea. (Patient not taking: Reported on 01/16/2023) 20 tablet 2   promethazine (PHENERGAN) 25 MG suppository Place 1 suppository (25 mg total) rectally every 6 (six) hours as needed for nausea or vomiting. (Patient not taking: Reported on 01/16/2023) 12 each 0   promethazine (PHENERGAN) 25 MG tablet Take 1 tablet (25 mg total) by mouth every 6 (six) hours as needed for nausea or vomiting. (Patient not taking: Reported on 01/16/2023) 30 tablet 2   No current facility-administered medications for this visit.    Review of Systems Review of Systems Constitutional: negative for fatigue and weight loss Respiratory: negative for cough and wheezing Cardiovascular: negative for chest pain, fatigue and palpitations Gastrointestinal:  positive for nausea.  negative for abdominal pain and change in bowel habits Genitourinary:negative Integument/breast: negative for nipple discharge Musculoskeletal:negative for myalgias Neurological: negative for gait problems and tremors Behavioral/Psych: negative for abusive relationship, depression Endocrine: negative for temperature intolerance      Blood pressure 101/63, pulse 84, weight 201 lb 9.6 oz (91.4 kg), unknown if currently breastfeeding.  Physical Exam Physical Exam General:   Alert and no distress  Skin:   no rash or abnormalities  Lungs:   clear to auscultation bilaterally  Heart:   regular rate and rhythm, S1, S2 normal, no murmur, click, rub or gallop  Pelvic Exam:  Deferred  I have spent a total of 15 minutes of face-to-face and non-face-to-face time, excluding clinical staff time, reviewing notes and preparing to see patient, ordering tests and/or medications, and counseling the patient.   Data Reviewed Labs  Assessment      1. Positive pregnancy test  - early pregnancy  Plan   Follow up prn  No orders of the  defined types were placed in this encounter.  No orders of the defined types were placed in this encounter.   Shelly Bombard, MD 01/16/2023 11:10 AM

## 2023-01-16 NOTE — Progress Notes (Signed)
Pt presents for NOB visit. Pt reports bleeding 1 week ago. Last PAP unknown.

## 2023-02-16 ENCOUNTER — Encounter: Payer: Medicaid Other | Admitting: Advanced Practice Midwife

## 2023-05-03 ENCOUNTER — Encounter (HOSPITAL_BASED_OUTPATIENT_CLINIC_OR_DEPARTMENT_OTHER): Payer: Self-pay | Admitting: Emergency Medicine

## 2023-05-03 ENCOUNTER — Emergency Department (HOSPITAL_BASED_OUTPATIENT_CLINIC_OR_DEPARTMENT_OTHER)
Admission: EM | Admit: 2023-05-03 | Discharge: 2023-05-03 | Disposition: A | Discharge status: Home / Self Care | Payer: Medicaid Other | Attending: Emergency Medicine | Admitting: Emergency Medicine

## 2023-05-03 ENCOUNTER — Other Ambulatory Visit: Payer: Self-pay

## 2023-05-03 DIAGNOSIS — R140 Abdominal distension (gaseous): Secondary | ICD-10-CM | POA: Insufficient documentation

## 2023-05-03 DIAGNOSIS — R197 Diarrhea, unspecified: Secondary | ICD-10-CM | POA: Diagnosis present

## 2023-05-03 HISTORY — DX: Calculus of gallbladder without cholecystitis without obstruction: K80.20

## 2023-05-03 LAB — C DIFFICILE QUICK SCREEN W PCR REFLEX
C Diff antigen: NEGATIVE
C Diff interpretation: NOT DETECTED
C Diff toxin: NEGATIVE

## 2023-05-03 LAB — COMPREHENSIVE METABOLIC PANEL
ALT: 14 U/L (ref 0–44)
AST: 11 U/L — ABNORMAL LOW (ref 15–41)
Albumin: 4.1 g/dL (ref 3.5–5.0)
Alkaline Phosphatase: 58 U/L (ref 38–126)
Anion gap: 9 (ref 5–15)
BUN: <5 mg/dL — ABNORMAL LOW (ref 6–20)
CO2: 22 mmol/L (ref 22–32)
Calcium: 9.5 mg/dL (ref 8.9–10.3)
Chloride: 106 mmol/L (ref 98–111)
Creatinine, Ser: 0.71 mg/dL (ref 0.44–1.00)
GFR, Estimated: >60 mL/min (ref >60)
Glucose, Bld: 80 mg/dL (ref 70–99)
Potassium: 3.4 mmol/L — ABNORMAL LOW (ref 3.5–5.1)
Sodium: 137 mmol/L (ref 135–145)
Total Bilirubin: 0.2 mg/dL — ABNORMAL LOW (ref 0.3–1.2)
Total Protein: 6.9 g/dL (ref 6.5–8.1)

## 2023-05-03 LAB — URINALYSIS, ROUTINE W REFLEX MICROSCOPIC
Bacteria, UA: NONE SEEN
Bilirubin Urine: NEGATIVE
Glucose, UA: NEGATIVE mg/dL
Ketones, ur: NEGATIVE mg/dL
Leukocytes,Ua: NEGATIVE
Nitrite: NEGATIVE
Specific Gravity, Urine: 1.026 (ref 1.005–1.030)
pH: 5.5 (ref 5.0–8.0)

## 2023-05-03 LAB — LIPASE, BLOOD: Lipase: 25 U/L (ref 11–51)

## 2023-05-03 LAB — CBC WITH DIFFERENTIAL/PLATELET
Abs Immature Granulocytes: 0.01 10*3/uL (ref 0.00–0.07)
Basophils Absolute: 0.1 10*3/uL (ref 0.0–0.1)
Basophils Relative: 1 %
Eosinophils Absolute: 0.3 10*3/uL (ref 0.0–0.5)
Eosinophils Relative: 3 %
HCT: 40.7 % (ref 36.0–46.0)
Hemoglobin: 13.3 g/dL (ref 12.0–15.0)
Immature Granulocytes: 0 %
Lymphocytes Relative: 32 %
Lymphs Abs: 2.4 10*3/uL (ref 0.7–4.0)
MCH: 29.5 pg (ref 26.0–34.0)
MCHC: 32.7 g/dL (ref 30.0–36.0)
MCV: 90.2 fL (ref 80.0–100.0)
Monocytes Absolute: 0.6 10*3/uL (ref 0.1–1.0)
Monocytes Relative: 8 %
Neutro Abs: 4.3 10*3/uL (ref 1.7–7.7)
Neutrophils Relative %: 56 %
Platelets: 358 10*3/uL (ref 150–400)
RBC: 4.51 MIL/uL (ref 3.87–5.11)
RDW: 12.3 % (ref 11.5–15.5)
WBC: 7.7 10*3/uL (ref 4.0–10.5)
nRBC: 0 % (ref 0.0–0.2)

## 2023-05-03 LAB — PREGNANCY, URINE: Preg Test, Ur: NEGATIVE

## 2023-05-03 MED ORDER — DICYCLOMINE HCL 20 MG PO TABS: 20.0000 mg | ORAL_TABLET | Freq: Two times a day (BID) | ORAL | 0 refills | Status: DC

## 2023-05-03 MED ORDER — SODIUM CHLORIDE 0.9 % IV BOLUS
1000.0000 mL | Freq: Once | INTRAVENOUS | Status: AC
  Administered 2023-05-03: 1000 mL via INTRAVENOUS

## 2023-05-03 MED ORDER — POTASSIUM CHLORIDE CRYS ER 20 MEQ PO TBCR
40.0000 meq | EXTENDED_RELEASE_TABLET | Freq: Once | ORAL | Status: AC
  Administered 2023-05-03: 40 meq via ORAL
  Filled 2023-05-03: qty 2

## 2023-05-03 MED ORDER — LOPERAMIDE HCL 2 MG PO CAPS
4.0000 mg | ORAL_CAPSULE | Freq: Once | ORAL | Status: AC
  Administered 2023-05-03: 4 mg via ORAL
  Filled 2023-05-03: qty 2

## 2023-05-03 MED ORDER — DICYCLOMINE HCL 10 MG PO CAPS
10.0000 mg | ORAL_CAPSULE | Freq: Once | ORAL | Status: AC
  Administered 2023-05-03: 10 mg via ORAL
  Filled 2023-05-03: qty 1

## 2023-05-03 MED ORDER — LOPERAMIDE HCL 2 MG PO CAPS: 2.0000 mg | ORAL_CAPSULE | Freq: Four times a day (QID) | ORAL | 0 refills | Status: DC | PRN

## 2023-05-03 NOTE — ED Triage Notes (Signed)
Diarrhea for a week and a half. Pt had not had a menses cycle. She started bleeding and it looks black.pt states she cannot get sleep because she had so much diarrhea.

## 2023-05-03 NOTE — Discharge Instructions (Signed)
You were seen in the emergency department for your diarrhea.  Your workup showed no signs of severe dehydration and just mildly low potassium that we repleted for you in the emergency department.  You should make sure that you are drinking plenty of fluids to stay well-hydrated and I have given you Bentyl to take for abdominal cramping and loperamide that you can take as needed for diarrhea.  Did send stool studies today that we will take 24 to 48 hours to result and if either of these come back positive you will receive a call.  You can follow-up your results on your patient portal.  You should follow-up with your primary doctor in the next few days to have your symptoms rechecked and if you are having continued diarrhea you should follow-up in the GI clinic.  You should return to the emergency department if you are having fevers, feeling severely dehydrated, you have black or bloody stools or if you have any other new or concerning symptoms.

## 2023-05-03 NOTE — ED Provider Notes (Signed)
EMERGENCY DEPARTMENT AT [facility] Provider Note   CSN: [id number] Arrival date & time: 05/03/23  1519     History  No chief complaint on file.   Mikayla Cabrera is a 37 y.o. female.  Patient is a 37 year old female with past medical history of cholestasis and recent abortion presenting to the emergency department with diarrhea.  The patient states that when she found out she was pregnant she had severe vomiting and constipation they thought was related to the cholestasis.  She states that those symptoms had resolved and after her abortion she was started on a birth control patch.  She states that she did not have a menstrual cycle since then.  She states that last week she started to have some intermittent bleeding.  She states that she was seen at [facility] and at [facility] and was told that she had a negative pregnancy test and that this get the normal on her birth control patch.  She states that last week she also started to develop diarrhea.  She states that she has had multiple episodes of diarrhea per day and feels like she cannot control her bowels.  She states that it appears like a green mucousy stool.  She denies any black or bloody stool.  She states she has been associated abdominal distention and swelling.  She denies any fevers, nausea or vomiting.  She denies any recent antibiotic use, recent travel or camping trips.  The history is provided by the patient.       Home Medications Prior to Admission medications   Medication Sig Start Date End Date Taking? Authorizing Provider  dicyclomine (BENTYL) 20 MG tablet Take 1 tablet (20 mg total) by mouth 2 (two) times daily. 05/03/23  Yes Elayne Snare K, DO  loperamide (IMODIUM) 2 MG capsule Take 1 capsule (2 mg total) by mouth 4 (four) times daily as needed for diarrhea or loose stools. 05/03/23  Yes Elayne Snare K, DO  Blood Pressure Monitoring (BLOOD PRESSURE KIT) DEVI 1 kit by Does  not apply route once a week. Patient not taking: Reported on 01/16/2023 12/24/22   Adam Phenix, MD  doxylamine, Sleep, (UNISOM) 25 MG tablet Take 1 tablet (25 mg total) by mouth at bedtime. Patient not taking: Reported on 01/16/2023 01/08/23   Hermina Staggers, MD  metoCLOPramide (REGLAN) 5 MG tablet Take 1 tablet (5 mg total) by mouth every 8 (eight) hours as needed for nausea or vomiting. Patient not taking: Reported on 01/16/2023 05/04/20   McDonald, Mia A, PA-C  metroNIDAZOLE (FLAGYL) 500 MG tablet Take 1 tablet (500 mg total) by mouth 2 (two) times daily. Patient not taking: Reported on 01/16/2023 01/10/21   Calvert Cantor, CNM  Misc. Devices (GOJJI WEIGHT SCALE) MISC 1 Device by Does not apply route every 30 (thirty) days. Patient not taking: Reported on 01/16/2023 12/24/22   Adam Phenix, MD  ondansetron (ZOFRAN-ODT) 4 MG disintegrating tablet Take 1 tablet (4 mg total) by mouth every 6 (six) hours as needed for nausea. Patient not taking: Reported on 01/16/2023 01/08/23   Hermina Staggers, MD  Prenat-Fe Poly-Methfol-FA-DHA (VITAFOL ULTRA) 29-0.6-0.4-200 MG CAPS Take 1 capsule by mouth daily. 12/24/22   Adam Phenix, MD  promethazine (PHENERGAN) 25 MG suppository Place 1 suppository (25 mg total) rectally every 6 (six) hours as needed for nausea or vomiting. Patient not taking: Reported on 01/16/2023 01/08/23   Hermina Staggers, MD  promethazine (PHENERGAN) 25 MG tablet  Take 1 tablet (25 mg total) by mouth every 6 (six) hours as needed for nausea or vomiting. Patient not taking: Reported on 01/16/2023 12/24/22   Adam Phenix, MD      Allergies    Patient has no known allergies.    Review of Systems   Review of Systems  Physical Exam Updated Vital Signs BP 122/81 (BP Location: Right Arm)   Pulse 70   Temp 98.5 F (36.9 C) (Oral)   Resp 18   Ht 5' 1.5" (1.562 m)   Wt 94.8 kg   LMP 09/21/2022   SpO2 97%   BMI 38.85 kg/m  Physical Exam Vitals and nursing note reviewed.   Constitutional:      General: She is not in acute distress.    Appearance: Normal appearance.  HENT:     Head: Normocephalic and atraumatic.     Nose: Nose normal.     Mouth/Throat:     Mouth: Mucous membranes are moist.     Pharynx: Oropharynx is clear.  Eyes:     Extraocular Movements: Extraocular movements intact.     Conjunctiva/sclera: Conjunctivae normal.  Cardiovascular:     Rate and Rhythm: Normal rate and regular rhythm.     Heart sounds: Normal heart sounds.  Pulmonary:     Effort: Pulmonary effort is normal.     Breath sounds: Normal breath sounds.  Abdominal:     General: There is distension.     Palpations: Abdomen is soft.     Tenderness: There is no abdominal tenderness. There is no guarding or rebound.  Musculoskeletal:        General: Normal range of motion.     Cervical back: Normal range of motion.  Skin:    General: Skin is warm and dry.  Neurological:     General: No focal deficit present.     Mental Status: She is alert and oriented to person, place, and time.  Psychiatric:        Mood and Affect: Mood normal.        Behavior: Behavior normal.     ED Results / Procedures / Treatments   Labs (all labs ordered are listed, but only abnormal results are displayed) Labs Reviewed  COMPREHENSIVE METABOLIC PANEL - Abnormal; Notable for the following components:      Result Value   Potassium 3.4 (*)    BUN <5 (*)    AST 11 (*)    Total Bilirubin 0.2 (*)    All other components within normal limits  URINALYSIS, ROUTINE W REFLEX MICROSCOPIC - Abnormal; Notable for the following components:   Hgb urine dipstick SMALL (*)    Protein, ur TRACE (*)    All other components within normal limits  C DIFFICILE QUICK SCREEN W PCR REFLEX    GASTROINTESTINAL PANEL BY PCR, STOOL (REPLACES STOOL CULTURE)  CBC WITH DIFFERENTIAL/PLATELET  PREGNANCY, URINE  LIPASE, BLOOD    EKG None  Radiology No results found.  Procedures Procedures    Medications  Ordered in ED Medications  loperamide (IMODIUM) capsule 4 mg (has no administration in time range)  sodium chloride 0.9 % bolus 1,000 mL (0 mLs Intravenous Stopped 05/03/23 1720)  dicyclomine (BENTYL) capsule 10 mg (10 mg Oral Given 05/03/23 1611)  potassium chloride SA (KLOR-CON M) CR tablet 40 mEq (40 mEq Oral Given 05/03/23 1750)    ED Course/ Medical Decision Making/ A&P Clinical Course as of 05/03/23 1818  Sun May 03, 2023  1745 Mild hypokalemia  will be repleted. Cr and LFTs normal without signs of severe dehdyration. Stool studies pending. She is stable for discharge home. No signs of sepsis or c diff risk factors so will be started on loperamide pending stool studies.  [VK]    Clinical Course User Index [VK] Rexford Maus, DO                             Medical Decision Making This patient presents to the ED with chief complaint(s) of diarrhea, abnormal uterine bleeding with pertinent past medical history of recent abortion, now on birth control patch which further complicates the presenting complaint. The complaint involves an extensive differential diagnosis and also carries with it a high risk of complications and morbidity.    The differential diagnosis includes dehydration, colitis, UTI, pregnancy, electrolyte abnormality, C. difficile versus other bacterial diarrhea  Additional history obtained: Additional history obtained from N/A Records reviewed previous admission documents  ED Course and Reassessment: On patient's arrival to the emergency department she is hemodynamically stable in no acute distress.  Her abdomen does feel distended and is soft and nontender.  The patient will be started on IV fluids and will have labs and stool studies performed and she will be closely reassessed.  Independent labs interpretation:  The following labs were independently interpreted: mild hypoK, otherwise within normal range  Independent visualization of  imaging: -N/A  Consultation: - Consulted or discussed management/test interpretation w/ external professional: N/A  Consideration for admission or further workup: Patient has no emergent conditions requiring admission or further work-up at this time and is stable for discharge home with primary care and GI follow-up  Social Determinants of health: N/A    Amount and/or Complexity of Data Reviewed Labs: ordered.  Risk Prescription drug management.          Final Clinical Impression(s) / ED Diagnoses Final diagnoses:  Diarrhea, unspecified type    Rx / DC Orders ED Discharge Orders          Ordered    dicyclomine (BENTYL) 20 MG tablet  2 times daily        05/03/23 1814    loperamide (IMODIUM) 2 MG capsule  4 times daily PRN        05/03/23 1814              Rexford Maus, DO 05/03/23 1818

## 2023-05-03 NOTE — ED Notes (Signed)
RN reviewed discharge instructions with pt. Pt verbalized understanding and had no further questions. VSS upon discharge.  

## 2023-05-04 ENCOUNTER — Telehealth (HOSPITAL_BASED_OUTPATIENT_CLINIC_OR_DEPARTMENT_OTHER): Payer: Self-pay | Admitting: Emergency Medicine

## 2023-05-04 MED ORDER — LOPERAMIDE HCL 2 MG PO CAPS: 2.0000 mg | ORAL_CAPSULE | Freq: Four times a day (QID) | ORAL | 0 refills | Status: AC | PRN

## 2023-05-04 MED ORDER — DICYCLOMINE HCL 20 MG PO TABS: 20.0000 mg | ORAL_TABLET | Freq: Two times a day (BID) | ORAL | 0 refills | Status: AC

## 2023-05-04 NOTE — Telephone Encounter (Signed)
Patient called to say she needed the prescription changed to [facility] on [address] due to being closed on holiday.

## 2023-05-05 ENCOUNTER — Telehealth (HOSPITAL_COMMUNITY): Payer: Self-pay | Admitting: Emergency Medicine

## 2023-05-05 LAB — GASTROINTESTINAL PANEL BY PCR, STOOL (REPLACES STOOL CULTURE)
Adenovirus F40/41: NOT DETECTED
Astrovirus: NOT DETECTED
Campylobacter species: NOT DETECTED
Cryptosporidium: NOT DETECTED
Cyclospora cayetanensis: NOT DETECTED
Entamoeba histolytica: NOT DETECTED
Enteroaggregative E coli (EAEC): DETECTED — AB
Enteropathogenic E coli (EPEC): NOT DETECTED
Enterotoxigenic E coli (ETEC): NOT DETECTED
Giardia lamblia: DETECTED — AB
Norovirus GI/GII: NOT DETECTED
Plesimonas shigelloides: NOT DETECTED
Rotavirus A: NOT DETECTED
Salmonella species: NOT DETECTED
Sapovirus (I, II, IV, and V): NOT DETECTED
Shiga like toxin producing E coli (STEC): NOT DETECTED
Shigella/Enteroinvasive E coli (EIEC): NOT DETECTED
Vibrio cholerae: NOT DETECTED
Vibrio species: NOT DETECTED
Yersinia enterocolitica: NOT DETECTED

## 2023-05-05 MED ORDER — METRONIDAZOLE 250 MG PO TABS: 250.0000 mg | ORAL_TABLET | Freq: Three times a day (TID) | ORAL | 0 refills | Status: AC

## 2023-05-05 NOTE — ED Notes (Signed)
RN attempted to contact pt via phone numbers in the chart however neither number was answered nor was a message able to be left.

## 2023-05-05 NOTE — ED Notes (Signed)
Informed provider Pollina of positive lab results

## 2023-05-05 NOTE — Telephone Encounter (Signed)
Positive for Giardia.  Prescription for Flagyl 250 mg p.o. 3 times daily for 7 days sent to patient's pharmacy.  Positive for E. Coli, likely does not need treatment.

## 2023-05-08 ENCOUNTER — Telehealth (HOSPITAL_COMMUNITY): Payer: Self-pay

## 2023-05-08 NOTE — Telephone Encounter (Signed)
Patient called regarding her test results. Updated. Instructed to go to her pharmacy to pick up the flagyl that was ordered.

## 2024-01-06 ENCOUNTER — Encounter (HOSPITAL_BASED_OUTPATIENT_CLINIC_OR_DEPARTMENT_OTHER): Payer: Self-pay | Admitting: Emergency Medicine

## 2024-01-06 ENCOUNTER — Emergency Department (HOSPITAL_BASED_OUTPATIENT_CLINIC_OR_DEPARTMENT_OTHER)
Admission: EM | Admit: 2024-01-06 | Discharge: 2024-01-06 | Disposition: A | Discharge status: Home / Self Care | Payer: Medicaid Other | Attending: Emergency Medicine | Admitting: Emergency Medicine

## 2024-01-06 ENCOUNTER — Other Ambulatory Visit: Payer: Self-pay

## 2024-01-06 DIAGNOSIS — Z20822 Contact with and (suspected) exposure to covid-19: Secondary | ICD-10-CM | POA: Insufficient documentation

## 2024-01-06 DIAGNOSIS — O26891 Other specified pregnancy related conditions, first trimester: Secondary | ICD-10-CM | POA: Insufficient documentation

## 2024-01-06 DIAGNOSIS — Z3491 Encounter for supervision of normal pregnancy, unspecified, first trimester: Secondary | ICD-10-CM

## 2024-01-06 DIAGNOSIS — Z3A01 Less than 8 weeks gestation of pregnancy: Secondary | ICD-10-CM | POA: Diagnosis not present

## 2024-01-06 DIAGNOSIS — M79671 Pain in right foot: Secondary | ICD-10-CM | POA: Insufficient documentation

## 2024-01-06 DIAGNOSIS — R531 Weakness: Secondary | ICD-10-CM | POA: Insufficient documentation

## 2024-01-06 DIAGNOSIS — M79672 Pain in left foot: Secondary | ICD-10-CM | POA: Diagnosis not present

## 2024-01-06 LAB — BASIC METABOLIC PANEL
Anion gap: 8 (ref 5–15)
BUN: 7 mg/dL (ref 6–20)
CO2: 20 mmol/L — ABNORMAL LOW (ref 22–32)
Calcium: 8.8 mg/dL — ABNORMAL LOW (ref 8.9–10.3)
Chloride: 106 mmol/L (ref 98–111)
Creatinine, Ser: 0.52 mg/dL (ref 0.44–1.00)
GFR, Estimated: >60 mL/min (ref >60)
Glucose, Bld: 106 mg/dL — ABNORMAL HIGH (ref 70–99)
Potassium: 3.7 mmol/L (ref 3.5–5.1)
Sodium: 134 mmol/L — ABNORMAL LOW (ref 135–145)

## 2024-01-06 LAB — URINALYSIS, ROUTINE W REFLEX MICROSCOPIC
Bacteria, UA: NONE SEEN
Bilirubin Urine: NEGATIVE
Glucose, UA: NEGATIVE mg/dL
Hgb urine dipstick: NEGATIVE
Ketones, ur: NEGATIVE mg/dL
Nitrite: NEGATIVE
Protein, ur: NEGATIVE mg/dL
Specific Gravity, Urine: 1.029 (ref 1.005–1.030)
pH: 6.5 (ref 5.0–8.0)

## 2024-01-06 LAB — CBC
HCT: 34.2 % — ABNORMAL LOW (ref 36.0–46.0)
Hemoglobin: 11.3 g/dL — ABNORMAL LOW (ref 12.0–15.0)
MCH: 29.6 pg (ref 26.0–34.0)
MCHC: 33 g/dL (ref 30.0–36.0)
MCV: 89.5 fL (ref 80.0–100.0)
Platelets: 340 10*3/uL (ref 150–400)
RBC: 3.82 MIL/uL — ABNORMAL LOW (ref 3.87–5.11)
RDW: 13.1 % (ref 11.5–15.5)
WBC: 7.2 10*3/uL (ref 4.0–10.5)
nRBC: 0 % (ref 0.0–0.2)

## 2024-01-06 LAB — RESP PANEL BY RT-PCR (RSV, FLU A&B, COVID)  RVPGX2
Influenza A by PCR: NEGATIVE
Influenza B by PCR: NEGATIVE
Resp Syncytial Virus by PCR: NEGATIVE
SARS Coronavirus 2 by RT PCR: NEGATIVE

## 2024-01-06 LAB — PREGNANCY, URINE: Preg Test, Ur: POSITIVE — AB

## 2024-01-06 LAB — HCG, QUANTITATIVE, PREGNANCY: hCG, Beta Chain, Quant, S: 125236 m[IU]/mL — ABNORMAL HIGH (ref <5)

## 2024-01-06 MED ORDER — ACETAMINOPHEN 500 MG PO TABS: 500.0000 mg | ORAL_TABLET | Freq: Four times a day (QID) | ORAL | 0 refills | Status: AC | PRN

## 2024-01-06 MED ORDER — PRENATAL VITAMIN 27-0.8 MG PO TABS: 1.0000 | ORAL_TABLET | Freq: Every day | ORAL | 1 refills | Status: AC

## 2024-01-06 NOTE — Discharge Instructions (Addendum)
Your labs today indicate that you are pregnant and likely between 6 to 8 weeks of pregnancy based on beta-hCG.  It is important for you to follow-up closely with OB/GYN for outpatient evaluation and management.  Take Tylenol as needed for aches and pain, check prenatal vitamin as prescribed return if you have any concern.

## 2024-01-06 NOTE — ED Triage Notes (Addendum)
Right calf pain-several weeks. Left foot pain-in the arch- several weeks. Has fallen several times in past several weeks from legs 'giving out' Feels fatigued, body aches.

## 2024-01-06 NOTE — ED Provider Notes (Signed)
[place] EMERGENCY DEPARTMENT AT [facility] Provider Note   CSN: [id number] Arrival date & time: 01/06/24  1736     History  No chief complaint on file.   Mikayla Cabrera is a 38 y.o. female.  The history is provided by the patient and medical records. No language interpreter was used.     38 year old female with history of anxiety, depression, presenting with multiple complaints.  Patient states she works a 12-hour jobs and is on her feet quite often.  For the past 3 weeks she noticed recurrent bilateral leg weakness and it caused her to fall several times.  She also complaining of pain to both of her feet left greater than right but attributed to having flatfoot.  She endorsed calf tenderness right greater than left but tenderness is not just in her calf is throughout her legs.  She noticed it more after she finished her 12-hour shifts.  She does not endorse any fever or chills no runny nose sneezing coughing no abdominal pain no vaginal bleeding or vaginal discharge.  Her last menstrual period was 2 weeks ago.  She is on birth control.  She denies any bowel bladder incontinence or saddle anesthesia.  She denies any lower back pain.  She is a G7, P4  Home Medications Prior to Admission medications   Medication Sig Start Date End Date Taking? Authorizing Provider  Blood Pressure Monitoring (BLOOD PRESSURE KIT) DEVI 1 kit by Does not apply route once a week. Patient not taking: Reported on 01/16/2023 12/24/22   Adam Phenix, MD  dicyclomine (BENTYL) 20 MG tablet Take 1 tablet (20 mg total) by mouth 2 (two) times daily. 05/04/23   Arby Barrette, MD  doxylamine, Sleep, (UNISOM) 25 MG tablet Take 1 tablet (25 mg total) by mouth at bedtime. Patient not taking: Reported on 01/16/2023 01/08/23   Hermina Staggers, MD  loperamide (IMODIUM) 2 MG capsule Take 1 capsule (2 mg total) by mouth 4 (four) times daily as needed for diarrhea or loose stools. 05/04/23   Arby Barrette, MD   metoCLOPramide (REGLAN) 5 MG tablet Take 1 tablet (5 mg total) by mouth every 8 (eight) hours as needed for nausea or vomiting. Patient not taking: Reported on 01/16/2023 05/04/20   McDonald, Pedro Earls A, PA-C  metroNIDAZOLE (FLAGYL) 250 MG tablet Take 1 tablet (250 mg total) by mouth 3 (three) times daily. 05/05/23   Gilda Crease, MD  metroNIDAZOLE (FLAGYL) 500 MG tablet Take 1 tablet (500 mg total) by mouth 2 (two) times daily. Patient not taking: Reported on 01/16/2023 01/10/21   Calvert Cantor, CNM  Misc. Devices (GOJJI WEIGHT SCALE) MISC 1 Device by Does not apply route every 30 (thirty) days. Patient not taking: Reported on 01/16/2023 12/24/22   Adam Phenix, MD  ondansetron (ZOFRAN-ODT) 4 MG disintegrating tablet Take 1 tablet (4 mg total) by mouth every 6 (six) hours as needed for nausea. Patient not taking: Reported on 01/16/2023 01/08/23   Hermina Staggers, MD  Prenat-Fe Poly-Methfol-FA-DHA (VITAFOL ULTRA) 29-0.6-0.4-200 MG CAPS Take 1 capsule by mouth daily. 12/24/22   Adam Phenix, MD  promethazine (PHENERGAN) 25 MG suppository Place 1 suppository (25 mg total) rectally every 6 (six) hours as needed for nausea or vomiting. Patient not taking: Reported on 01/16/2023 01/08/23   Hermina Staggers, MD  promethazine (PHENERGAN) 25 MG tablet Take 1 tablet (25 mg total) by mouth every 6 (six) hours as needed for nausea or vomiting. Patient not taking: Reported  on 01/16/2023 12/24/22   Adam Phenix, MD      Allergies    Patient has no known allergies.    Review of Systems   Review of Systems  All other systems reviewed and are negative.   Physical Exam Updated Vital Signs BP 106/63 (BP Location: Right Arm)   Pulse 82   Temp 98.8 F (37.1 C)   Resp 16   SpO2 100%  Physical Exam Vitals and nursing note reviewed.  Constitutional:      General: She is not in acute distress.    Appearance: She is well-developed.  HENT:     Head: Atraumatic.  Eyes:     Conjunctiva/sclera:  Conjunctivae normal.  Cardiovascular:     Rate and Rhythm: Normal rate and regular rhythm.     Pulses: Normal pulses.     Heart sounds: Normal heart sounds.  Pulmonary:     Effort: Pulmonary effort is normal.  Abdominal:     Palpations: Abdomen is soft.     Tenderness: There is no abdominal tenderness.  Musculoskeletal:     Cervical back: Neck supple.     Comments: Examination of bilateral extremities without any concerning finding.  She has normal skin turgor, no provide significant edema noted.  Nontender, Dorsalis Pedis Pulse Palpable Bilaterally, She Has Normal Dorsiflexion and Plantarflexion's of Her Feet.  Patellar Region Reflexes Intact Bilaterally.  Hip with Full Range Of Motion.  Skin:    Findings: No rash.  Neurological:     Mental Status: She is alert.  Psychiatric:        Mood and Affect: Mood normal.     ED Results / Procedures / Treatments   Labs (all labs ordered are listed, but only abnormal results are displayed) Labs Reviewed  CBC - Abnormal; Notable for the following components:      Result Value   RBC 3.82 (*)    Hemoglobin 11.3 (*)    HCT 34.2 (*)    All other components within normal limits  BASIC METABOLIC PANEL - Abnormal; Notable for the following components:   Sodium 134 (*)    CO2 20 (*)    Glucose, Bld 106 (*)    Calcium 8.8 (*)    All other components within normal limits  URINALYSIS, ROUTINE W REFLEX MICROSCOPIC - Abnormal; Notable for the following components:   APPearance HAZY (*)    Leukocytes,Ua TRACE (*)    All other components within normal limits  PREGNANCY, URINE - Abnormal; Notable for the following components:   Preg Test, Ur POSITIVE (*)    All other components within normal limits  HCG, QUANTITATIVE, PREGNANCY - Abnormal; Notable for the following components:   hCG, Beta Chain, Quant, S 125,236 (*)    All other components within normal limits  RESP PANEL BY RT-PCR (RSV, FLU A&B, COVID)  [id number]    EKG None  Radiology No  results found.  Procedures Procedures    Medications Ordered in ED Medications - No data to display  ED Course/ Medical Decision Making/ A&P                                 Medical Decision Making Amount and/or Complexity of Data Reviewed Labs: ordered.  Risk OTC drugs.   BP 106/63 (BP Location: Right Arm)   Pulse 82   Temp 98.8 F (37.1 C)   Resp 16   SpO2 100%   8:14  PM  38 year old female with history of anxiety, depression, presenting with multiple complaints.  Patient states she works a 12-hour jobs and is on her feet quite often.  For the past 3 weeks she noticed recurrent bilateral leg weakness and it caused her to fall several times.  She also complaining of pain to both of her feet left greater than right but attributed to having flatfoot.  She endorsed calf tenderness right greater than left but tenderness is not just in her calf is throughout her legs.  She noticed it more after she finished her 12-hour shifts.  She does not endorse any fever or chills no runny nose sneezing coughing no abdominal pain no vaginal bleeding or vaginal discharge.  Her last menstrual period was 2 weeks ago.  She is on birth control.  She denies any bowel bladder incontinence or saddle anesthesia.  She denies any lower back pain.  She is a G7, P4  On exam, this is a well-appearing female resting comfortably in bed appears to be in no acute discomfort.  Heart with normal rate and rhythm, lungs are clear to auscultation bilaterally abdomen is soft nontender no tenderness noted to palpation of her lower back.  She has full range of motion to bilateral lower extremities with equal strength.  No signs of infection no calf tenderness no significant edema noted.  She is neurovascular intact.  -Labs ordered, independently viewed and interpreted by me.  Labs remarkable for hCG of 125k, likely between 6-8 weeks of pregnancy.  UA without UTI.  Covid, flu, rsv negative.  Normal WBC -The patient was  maintained on a cardiac monitor.  I personally viewed and interpreted the cardiac monitored which showed an underlying rhythm of: NSR -Imaging independently viewed and interpreted by me and I agree with radiologist's interpretation.  Result remarkable for transvaginal US to confirm IUP, but pt without abd pain or vaginal bleeding.  She prefers to f/u with [facility] for outpt f/u -This patient presents to the ED for concern of multiple compaints, this involves an extensive number of treatment options, and is a complaint that carries with it a high risk of complications and morbidity.  The differential diagnosis includes plantar fasciitis, DVT, caudal equina, electrolytes imbalance, stroke, spinal infection -Co morbidities that complicate the patient evaluation includes anxiety, depression -Treatment offered including tylenol but pt declined -Reevaluation of the patient after these medicines showed that the patient stayed the same -PCP office notes or outside notes reviewed -Escalation to admission/observation considered: patients feels much better, is comfortable with discharge, and will follow up with obgyn -Prescription medication considered, patient comfortable with prenatal vitamins and tylenol -Social Determinant of Health considered which includes tobacco use, depression  Prior to discharge, patient have a documented blood pressure of 86/63 however she is not tachycardic and she is not on any blood pressure medication could suppress her heart rate.  On recheck blood pressure is 97/55 and once again patient is not tachycardic.  She is not anemic, she has normal renal function.  She is able to ambulate.  Will have her follow-up closely with OB/GYN but I gave patient return precaution.  Without any complaints of shortness of breath I have very low suspicion for PE or DVT.        Final Clinical Impression(s) / ED Diagnoses Final diagnoses:  First trimester pregnancy    Rx / DC  Orders ED Discharge Orders          Ordered    Prenatal Vit-Fe Fumarate-FA (  PRENATAL VITAMIN) 27-0.8 MG TABS  Daily        01/06/24 2240    acetaminophen (TYLENOL) 500 MG tablet  Every 6 hours PRN        01/06/24 2240              Fayrene Helper, PA-C 01/06/24 2317    Rondel Baton, MD 01/07/24 [id number]
